# Patient Record
Sex: Male | Born: 1970 | Race: White | Hispanic: No | Marital: Married | Smoking: Never smoker
Health system: Southern US, Community
[De-identification: ages and names within clinical notes are randomized; demographics above are authoritative.]

## PROBLEM LIST (undated history)

## (undated) DIAGNOSIS — Z87442 Personal history of urinary calculi: Secondary | ICD-10-CM

## (undated) DIAGNOSIS — G4730 Sleep apnea, unspecified: Secondary | ICD-10-CM

## (undated) HISTORY — PX: APPENDECTOMY: SHX54

---
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
## 2002-06-01 ENCOUNTER — Encounter: Payer: Self-pay | Admitting: Emergency Medicine

## 2002-06-01 ENCOUNTER — Emergency Department (HOSPITAL_COMMUNITY): Admission: EM | Admit: 2002-06-01 | Discharge: 2002-06-01 | Payer: Self-pay | Admitting: Emergency Medicine

## 2013-06-29 HISTORY — PX: PATELLAR TENDON REPAIR: SHX737

## 2014-10-30 ENCOUNTER — Other Ambulatory Visit: Payer: Self-pay | Admitting: Surgery

## 2014-11-20 ENCOUNTER — Encounter (HOSPITAL_BASED_OUTPATIENT_CLINIC_OR_DEPARTMENT_OTHER): Payer: Self-pay | Admitting: *Deleted

## 2014-11-20 NOTE — Progress Notes (Signed)
This patient has screened at an elevated risk for Obstructive Sleep Apnea using the STOP-Bang tool during a presurgical visit. A score of 4 or greater is an elevated risk. 

## 2014-11-25 NOTE — H&P (Signed)
Christian Foster  Location: [facility] Patient #: [id number] DOB: July 19, 1970 Married / Language: English / Race: White Male  History of Present Illness (Mitchelle Sultan A. Magnus Ivan MD;  The patient is a 44 year old male who presents with skin lesions. This is a pleasant gentleman referred by Dr. Rushie Chestnut for evaluation of an infected sebaceous cyst on his right upper arm/shoulder. He has had for several years but recently became infected. It improved with oral antibiotics. He reports no discomfort today. He is otherwise healthy and without complaints.   Other Problems (Sonya Bynum, CMA Anxiety Disorder Back Pain  Past Surgical History Lamar Laundry Bynum, CMA; Appendectomy Knee Surgery Left. Oral Surgery  Diagnostic Studies History Gilmer Mor, CMA;  Colonoscopy never  Allergies Lamar Laundry Bynum, CMA;  No Known Drug Allergies06/07/2014  Medication History (Sonya Bynum, CMA; Adderall XR (  Capsule ER 24HR, Oral) Active. Escitalopram Oxalate (  Tablet, Oral) Active. Medications Reconciled  Social History Lamar Laundry Bynum, CMA; Alcohol use Occasional alcohol use. Caffeine use Coffee. No drug use Tobacco use Never smoker.  Family History (Sonya Bynum, CMA;  Depression Father, Mother. Hypertension Father. Migraine Headache Mother. Prostate Cancer Father.  Review of Systems Lamar Laundry Bynum CMA;  General Not Present- Appetite Loss, Chills, Fatigue, Fever, Night Sweats, Weight Gain and Weight Loss. Skin Present- New Lesions. Not Present- Change in Wart/Mole, Dryness, Hives, Jaundice, Non-Healing Wounds, Rash and Ulcer. HEENT Present- Oral Ulcers. Not Present- Earache, Hearing Loss, Hoarseness, Nose Bleed, Ringing in the Ears, Seasonal Allergies, Sinus Pain, Sore Throat, Visual Disturbances, Wears glasses/contact lenses and Yellow Eyes. Respiratory Not Present- Bloody sputum, Chronic Cough, Difficulty Breathing, Snoring and Wheezing. Breast Not Present- Breast  Mass, Breast Pain, Nipple Discharge and Skin Changes. Cardiovascular Not Present- Chest Pain, Difficulty Breathing Lying Down, Leg Cramps, Palpitations, Rapid Heart Rate, Shortness of Breath and Swelling of Extremities. Gastrointestinal Not Present- Abdominal Pain, Bloating, Bloody Stool, Change in Bowel Habits, Chronic diarrhea, Constipation, Difficulty Swallowing, Excessive gas, Gets full quickly at meals, Hemorrhoids, Indigestion, Nausea, Rectal Pain and Vomiting. Male Genitourinary Not Present- Blood in Urine, Change in Urinary Stream, Frequency, Impotence, Nocturia, Painful Urination, Urgency and Urine Leakage. Musculoskeletal Present- Back Pain and Joint Pain. Not Present- Joint Stiffness, Muscle Pain, Muscle Weakness and Swelling of Extremities. Neurological Not Present- Decreased Memory, Fainting, Headaches, Numbness, Seizures, Tingling, Tremor, Trouble walking and Weakness. Psychiatric Not Present- Anxiety, Bipolar, Change in Sleep Pattern, Depression, Fearful and Frequent crying. Endocrine Not Present- Cold Intolerance, Excessive Hunger, Hair Changes, Heat Intolerance, Hot flashes and New Diabetes. Hematology Not Present- Easy Bruising, Excessive bleeding, Gland problems, HIV and Persistent Infections.   Vitals (Sonya Bynum CMA; 10/30/2014 10:46 AM Weight: 224 lb Height: 67in Body Surface Area: 2.19 m Body Mass Index: 35.08 kg/m Temp.: 62F(Temporal)  Pulse: 79 (Regular)  BP: 132/80 (Sitting, Left Arm, Standard)    Physical Exam The physical exam findings are as follows: Note:Lungs are clear bilaterally Cardiovascular regular rate and rhythm There is a 4 cm sebaceous cyst on his upper arm/deltoid area with mild erythema and tenderness Lungs clear cv RRR Abdomen soft,NT/ND    Assessment & Plan (Caleigh Rabelo A. Magnus Ivan MD;  INFECTED SEBACEOUS CYST (706.2  L72.3) Impression: Surgical excision is highly recommended given the size of this sebaceous cyst and its prior  infection. I discussed this with him in detail. I discussed the risks of surgery which includes but is not limited to bleeding, infection, having a chronic open wound, recurrence, etc. He understands and wishes to proceed with surgery Current Plans  Started Doxycycline  Hyclate 100MG , 1 (one) Capsule two times daily, #20, 10/30/2014, No Refill.

## 2014-11-26 ENCOUNTER — Encounter (HOSPITAL_BASED_OUTPATIENT_CLINIC_OR_DEPARTMENT_OTHER): Payer: Self-pay | Admitting: *Deleted

## 2014-11-26 ENCOUNTER — Ambulatory Visit (HOSPITAL_BASED_OUTPATIENT_CLINIC_OR_DEPARTMENT_OTHER)
Admission: RE | Admit: 2014-11-26 | Discharge: 2014-11-26 | Disposition: A | Discharge status: Home / Self Care | Payer: 59 | Source: Ambulatory Visit | Attending: Surgery | Admitting: Surgery

## 2014-11-26 ENCOUNTER — Ambulatory Visit (HOSPITAL_BASED_OUTPATIENT_CLINIC_OR_DEPARTMENT_OTHER): Payer: 59 | Admitting: Anesthesiology

## 2014-11-26 ENCOUNTER — Encounter (HOSPITAL_BASED_OUTPATIENT_CLINIC_OR_DEPARTMENT_OTHER)
Admission: RE | Disposition: A | Discharge status: Home / Self Care | Payer: Self-pay | Source: Ambulatory Visit | Attending: Surgery

## 2014-11-26 DIAGNOSIS — F419 Anxiety disorder, unspecified: Secondary | ICD-10-CM | POA: Insufficient documentation

## 2014-11-26 DIAGNOSIS — G4730 Sleep apnea, unspecified: Secondary | ICD-10-CM | POA: Insufficient documentation

## 2014-11-26 DIAGNOSIS — M549 Dorsalgia, unspecified: Secondary | ICD-10-CM | POA: Insufficient documentation

## 2014-11-26 DIAGNOSIS — Z8042 Family history of malignant neoplasm of prostate: Secondary | ICD-10-CM | POA: Insufficient documentation

## 2014-11-26 DIAGNOSIS — L723 Sebaceous cyst: Secondary | ICD-10-CM | POA: Diagnosis not present

## 2014-11-26 DIAGNOSIS — Z79899 Other long term (current) drug therapy: Secondary | ICD-10-CM | POA: Diagnosis not present

## 2014-11-26 DIAGNOSIS — Z8249 Family history of ischemic heart disease and other diseases of the circulatory system: Secondary | ICD-10-CM | POA: Insufficient documentation

## 2014-11-26 DIAGNOSIS — Z818 Family history of other mental and behavioral disorders: Secondary | ICD-10-CM | POA: Insufficient documentation

## 2014-11-26 HISTORY — PX: EAR CYST EXCISION: SHX22

## 2014-11-26 HISTORY — DX: Sleep apnea, unspecified: G47.30

## 2014-11-26 LAB — POCT HEMOGLOBIN-HEMACUE: HEMOGLOBIN: 17.2 g/dL — AB (ref 13.0–17.0)

## 2014-11-26 SURGERY — CYST REMOVAL
Anesthesia: Monitor Anesthesia Care | Laterality: Right

## 2014-11-26 MED ORDER — LACTATED RINGERS IV SOLN: 500.0000 mL | INTRAVENOUS | Status: DC

## 2014-11-26 MED ORDER — CEFAZOLIN SODIUM-DEXTROSE 2-3 GM-% IV SOLR
INTRAVENOUS | Status: AC
  Filled 2014-11-26: qty 50

## 2014-11-26 MED ORDER — MIDAZOLAM HCL 2 MG/2ML IJ SOLN
1.0000 mg | INTRAMUSCULAR | Status: DC | PRN
  Administered 2014-11-26: 2 mg via INTRAVENOUS

## 2014-11-26 MED ORDER — FENTANYL CITRATE (PF) 100 MCG/2ML IJ SOLN
50.0000 ug | INTRAMUSCULAR | Status: DC | PRN
  Administered 2014-11-26: 50 ug via INTRAVENOUS

## 2014-11-26 MED ORDER — ONDANSETRON HCL 4 MG/2ML IJ SOLN
INTRAMUSCULAR | Status: DC | PRN
  Administered 2014-11-26: 4 mg via INTRAVENOUS

## 2014-11-26 MED ORDER — HYDROCODONE-ACETAMINOPHEN 5-325 MG PO TABS: 1.0000 | ORAL_TABLET | ORAL | Status: DC | PRN

## 2014-11-26 MED ORDER — CEFAZOLIN SODIUM-DEXTROSE 2-3 GM-% IV SOLR
2.0000 g | Freq: Three times a day (TID) | INTRAVENOUS | Status: DC
  Administered 2014-11-26: 2 g via INTRAVENOUS

## 2014-11-26 MED ORDER — BUPIVACAINE HCL (PF) 0.25 % IJ SOLN
INTRAMUSCULAR | Status: AC
  Filled 2014-11-26: qty 30

## 2014-11-26 MED ORDER — PROPOFOL INFUSION 10 MG/ML OPTIME
INTRAVENOUS | Status: DC | PRN
  Administered 2014-11-26: 75 ug/kg/min via INTRAVENOUS

## 2014-11-26 MED ORDER — GLYCOPYRROLATE 0.2 MG/ML IJ SOLN: 0.2000 mg | Freq: Once | INTRAMUSCULAR | Status: DC | PRN

## 2014-11-26 MED ORDER — LIDOCAINE HCL (PF) 1 % IJ SOLN
INTRAMUSCULAR | Status: AC
Start: 2014-11-26 — End: 2014-11-26
  Filled 2014-11-26: qty 30

## 2014-11-26 MED ORDER — HYDROMORPHONE HCL 1 MG/ML IJ SOLN: 0.2500 mg | INTRAMUSCULAR | Status: DC | PRN

## 2014-11-26 MED ORDER — LIDOCAINE HCL (PF) 1 % IJ SOLN
INTRAMUSCULAR | Status: DC | PRN
  Administered 2014-11-26: 10 mL

## 2014-11-26 MED ORDER — MIDAZOLAM HCL 2 MG/2ML IJ SOLN
INTRAMUSCULAR | Status: AC
  Filled 2014-11-26: qty 2

## 2014-11-26 MED ORDER — LIDOCAINE HCL (CARDIAC) 20 MG/ML IV SOLN
INTRAVENOUS | Status: DC | PRN
  Administered 2014-11-26: 60 mg via INTRAVENOUS

## 2014-11-26 MED ORDER — PROMETHAZINE HCL 25 MG/ML IJ SOLN
6.2500 mg | INTRAMUSCULAR | Status: DC | PRN
Start: 2014-11-26 — End: 2014-11-26

## 2014-11-26 MED ORDER — BACITRACIN ZINC 500 UNIT/GM EX OINT
TOPICAL_OINTMENT | CUTANEOUS | Status: AC
  Filled 2014-11-26: qty 28.35

## 2014-11-26 MED ORDER — BUPIVACAINE-EPINEPHRINE (PF) 0.5% -1:200000 IJ SOLN
INTRAMUSCULAR | Status: AC
  Filled 2014-11-26: qty 90

## 2014-11-26 MED ORDER — KETOROLAC TROMETHAMINE 30 MG/ML IJ SOLN
INTRAMUSCULAR | Status: DC | PRN
  Administered 2014-11-26: 30 mg via INTRAVENOUS

## 2014-11-26 MED ORDER — SCOPOLAMINE 1 MG/3DAYS TD PT72: 1.0000 | MEDICATED_PATCH | Freq: Once | TRANSDERMAL | Status: DC | PRN

## 2014-11-26 MED ORDER — DOXYCYCLINE HYCLATE 100 MG PO TABS: 100.0000 mg | ORAL_TABLET | Freq: Two times a day (BID) | ORAL | Status: DC

## 2014-11-26 MED ORDER — FENTANYL CITRATE (PF) 100 MCG/2ML IJ SOLN
INTRAMUSCULAR | Status: AC
  Filled 2014-11-26: qty 4

## 2014-11-26 MED ORDER — LACTATED RINGERS IV SOLN
INTRAVENOUS | Status: DC
  Administered 2014-11-26: 09:00:00 via INTRAVENOUS

## 2014-11-26 SURGICAL SUPPLY — 48 items
BLADE CLIPPER SURG (BLADE) ×2 IMPLANT
BLADE HEX COATED 2.75 (ELECTRODE) ×3 IMPLANT
BLADE SURG 15 STRL LF DISP TIS (BLADE) ×1 IMPLANT
BLADE SURG 15 STRL SS (BLADE) ×3
CANISTER SUCT 1200ML W/VALVE (MISCELLANEOUS) IMPLANT
CHLORAPREP W/TINT 26ML (MISCELLANEOUS) ×3 IMPLANT
CLOSURE WOUND 1/2 X4 (GAUZE/BANDAGES/DRESSINGS) ×1
COVER BACK TABLE 60X90IN (DRAPES) ×3 IMPLANT
COVER MAYO STAND STRL (DRAPES) ×3 IMPLANT
DECANTER SPIKE VIAL GLASS SM (MISCELLANEOUS) IMPLANT
DRAPE LAPAROTOMY 100X72 PEDS (DRAPES) ×3 IMPLANT
DRAPE UTILITY XL STRL (DRAPES) ×3 IMPLANT
DRSG TEGADERM 4X4.75 (GAUZE/BANDAGES/DRESSINGS) ×3 IMPLANT
ELECT REM PT RETURN 9FT ADLT (ELECTROSURGICAL) ×3
ELECTRODE REM PT RTRN 9FT ADLT (ELECTROSURGICAL) ×1 IMPLANT
GLOVE BIOGEL PI IND STRL 7.0 (GLOVE) IMPLANT
GLOVE BIOGEL PI INDICATOR 7.0 (GLOVE) ×2
GLOVE ECLIPSE 6.5 STRL STRAW (GLOVE) ×2 IMPLANT
GLOVE EXAM NITRILE MD LF STRL (GLOVE) ×2 IMPLANT
GLOVE SURG SIGNA 7.5 PF LTX (GLOVE) ×3 IMPLANT
GOWN STRL REUS W/ TWL LRG LVL3 (GOWN DISPOSABLE) ×1 IMPLANT
GOWN STRL REUS W/ TWL XL LVL3 (GOWN DISPOSABLE) ×1 IMPLANT
GOWN STRL REUS W/TWL LRG LVL3 (GOWN DISPOSABLE) ×3
GOWN STRL REUS W/TWL XL LVL3 (GOWN DISPOSABLE) ×3
LIQUID BAND (GAUZE/BANDAGES/DRESSINGS) ×3 IMPLANT
NDL HYPO 25X1 1.5 SAFETY (NEEDLE) ×1 IMPLANT
NEEDLE HYPO 25X1 1.5 SAFETY (NEEDLE) ×3 IMPLANT
NS IRRIG 1000ML POUR BTL (IV SOLUTION) IMPLANT
PACK BASIN DAY SURGERY FS (CUSTOM PROCEDURE TRAY) ×3 IMPLANT
PENCIL BUTTON HOLSTER BLD 10FT (ELECTRODE) ×3 IMPLANT
SLEEVE SCD COMPRESS KNEE MED (MISCELLANEOUS) ×2 IMPLANT
SPONGE GAUZE 4X4 12PLY STER LF (GAUZE/BANDAGES/DRESSINGS) ×3 IMPLANT
SPONGE LAP 4X18 X RAY DECT (DISPOSABLE) ×3 IMPLANT
STRIP CLOSURE SKIN 1/2X4 (GAUZE/BANDAGES/DRESSINGS) ×2 IMPLANT
SUT MNCRL AB 4-0 PS2 18 (SUTURE) ×2 IMPLANT
SUT PROLENE 3 0 PS 2 (SUTURE) IMPLANT
SUT VIC AB 2-0 SH 27 (SUTURE)
SUT VIC AB 2-0 SH 27XBRD (SUTURE) IMPLANT
SUT VIC AB 3-0 SH 27 (SUTURE) ×3
SUT VIC AB 3-0 SH 27X BRD (SUTURE) IMPLANT
SYR BULB 3OZ (MISCELLANEOUS) IMPLANT
SYR CONTROL 10ML LL (SYRINGE) ×3 IMPLANT
TOWEL OR 17X24 6PK STRL BLUE (TOWEL DISPOSABLE) ×1 IMPLANT
TOWEL OR NON WOVEN STRL DISP B (DISPOSABLE) ×3 IMPLANT
TRAY DSU PREP LF (CUSTOM PROCEDURE TRAY) IMPLANT
TUBE CONNECTING 20'X1/4 (TUBING)
TUBE CONNECTING 20X1/4 (TUBING) IMPLANT
YANKAUER SUCT BULB TIP NO VENT (SUCTIONS) IMPLANT

## 2014-11-26 NOTE — Anesthesia Postprocedure Evaluation (Signed)
Anesthesia Post Note  Patient: Christian KindredAlex Foster  Procedure(s) Performed: Procedure(s) (LRB): EXCISION OF RIGHT ARM SEBACEOUS CYST (Right)  Anesthesia type: MAC  Patient location: PACU  Post pain: Pain level controlled  Post assessment: Patient's Cardiovascular Status Stable  Last Vitals:  Filed Vitals:   11/26/14 1030  BP: 132/75  Pulse: 63  Temp:   Resp: 15    Post vital signs: Reviewed and stable  Level of consciousness: sedated  Complications: No apparent anesthesia complications

## 2014-11-26 NOTE — Op Note (Signed)
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

## 2014-11-26 NOTE — Interval H&P Note (Signed)
History and Physical Interval Note:no change in H and P  11/26/2014 9:03 AM  Christian Foster  has presented today for surgery, with the diagnosis of Cyst on Right Arm  The various methods of treatment have been discussed with the patient and family. After consideration of risks, benefits and other options for treatment, the patient has consented to  Procedure(s): EXCISION OF RIGHT ARM  (Right) as a surgical intervention .  The patient's history has been reviewed, patient examined, no change in status, stable for surgery.  I have reviewed the patient's chart and labs.  Questions were answered to the patient's satisfaction.     [name] A

## 2014-11-26 NOTE — Transfer of Care (Signed)
Immediate Anesthesia Transfer of Care Note  Patient: Tora KindredAlex Araki  Procedure(s) Performed: Procedure(s): EXCISION OF RIGHT ARM SEBACEOUS CYST (Right)  Patient Location: PACU  Anesthesia Type:MAC  Level of Consciousness: awake, alert , oriented and patient cooperative  Airway & Oxygen Therapy: Patient Spontanous Breathing and Patient connected to face mask oxygen  Post-op Assessment: Report given to RN and Post -op Vital signs reviewed and stable  Post vital signs: Reviewed and stable  Last Vitals:  Filed Vitals:   11/26/14 0904  BP: 124/72  Temp: 36.7 C  Resp: 20    Complications: No apparent anesthesia complications

## 2014-11-26 NOTE — Anesthesia Preprocedure Evaluation (Addendum)
Anesthesia Evaluation  Patient identified by MRN, date of birth, ID band Patient awake    Reviewed: Allergy & Precautions, NPO status , Patient's Chart, lab work & pertinent test results  History of Anesthesia Complications Negative for: history of anesthetic complications  Airway Mallampati: II  TM Distance: >3 FB Neck ROM: Full    Dental  (+) Teeth Intact, Dental Advisory Given   Pulmonary sleep apnea ,    Pulmonary exam normal       Cardiovascular negative cardio ROS Normal cardiovascular exam    Neuro/Psych negative neurological ROS  negative psych ROS   GI/Hepatic Neg liver ROS,   Endo/Other  negative endocrine ROS  Renal/GU negative Renal ROS     Musculoskeletal   Abdominal   Peds  Hematology   Anesthesia Other Findings   Reproductive/Obstetrics                            Anesthesia Physical Anesthesia Plan  ASA: II  Anesthesia Plan: MAC   Post-op Pain Management:    Induction:   Airway Management Planned: Simple Face Mask  Additional Equipment:   Intra-op Plan:   Post-operative Plan:   Informed Consent: I have reviewed the patients History and Physical, chart, labs and discussed the procedure including the risks, benefits and alternatives for the proposed anesthesia with the patient or authorized representative who has indicated his/her understanding and acceptance.   Dental advisory given  Plan Discussed with: CRNA, Anesthesiologist and Surgeon  Anesthesia Plan Comments:         Anesthesia Quick Evaluation

## 2014-11-26 NOTE — Discharge Instructions (Signed)
Ok to shower starting tomorrow  No soaking in a tub or swimming for one week  Ibuprofen also for pain     Post Anesthesia Home Care Instructions  Activity: Get plenty of rest for the remainder of the day. A responsible adult should stay with you for 24 hours following the procedure.  For the next 24 hours, DO NOT: -Drive a car -Advertising copywriterperate machinery -Drink alcoholic beverages -Take any medication unless instructed by your physician -Make any legal decisions or sign important papers.  Meals: Start with liquid foods such as gelatin or soup. Progress to regular foods as tolerated. Avoid greasy, spicy, heavy foods. If nausea and/or vomiting occur, drink only clear liquids until the nausea and/or vomiting subsides. Call your physician if vomiting continues.  Special Instructions/Symptoms: Your throat may feel dry or sore from the anesthesia or the breathing tube placed in your throat during surgery. If this causes discomfort, gargle with warm salt water. The discomfort should disappear within 24 hours.  If you had a scopolamine patch placed behind your ear for the management of post- operative nausea and/or vomiting:  1. The medication in the patch is effective for 72 hours, after which it should be removed.  Wrap patch in a tissue and discard in the trash. Wash hands thoroughly with soap and water. 2. You may remove the patch earlier than 72 hours if you experience unpleasant side effects which may include dry mouth, dizziness or visual disturbances. 3. Avoid touching the patch. Wash your hands with soap and water after contact with the patch.

## 2014-11-26 NOTE — Anesthesia Procedure Notes (Signed)
Procedure Name: MAC Date/Time: 11/26/2014 9:30 AM Performed by: Shaeley Segall D Pre-anesthesia Checklist: Emergency Drugs available, Suction available, Patient being monitored, Patient identified and Timeout performed Patient Re-evaluated:Patient Re-evaluated prior to inductionOxygen Delivery Method: Simple face mask

## 2014-11-27 ENCOUNTER — Encounter (HOSPITAL_BASED_OUTPATIENT_CLINIC_OR_DEPARTMENT_OTHER): Payer: Self-pay | Admitting: Surgery

## 2014-11-27 NOTE — Op Note (Signed)
[name]:  Christian Foster, Christian Foster                ACCOUNT NO.:  [id number]  MEDICAL RECORD NO.:  [id number]  LOCATION:                               FACILITY:  [facility]  PHYSICIAN:  Abigail Miyamotoouglas Darral Rishel, M.D. 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  PROCEDURE:  Excision of right arm, 3 cm sebaceous cyst.  SURGEON:  Abigail Miyamotoouglas Lorimer Tiberio, M.D.  ANESTHESIA:  1% lidocaine and monitored anesthesia care.  ESTIMATED BLOOD LOSS:  Minimal.  FINDINGS:  The patient was found to have a chronic appearing sebaceous cyst which was approximately 3 cm in size on the right lateral arm.  It has been previously infected.  PROCEDURE IN DETAIL:  The patient was brought to the operating room, identified as McDonald's Corporationlice Facundo.  He was placed supine on the operating room table and anesthesia was induced.  His right arm was then prepped and draped in usual sterile fashion.  I anesthetized the skin over the palpable cyst just over the lateral arm below the deltoid.  I made an incision with a scalpel and I took this down to the cyst with the electrocautery.  The cyst was consistent with a large sebaceous cyst.  I excised in its entirety and sent to Pathology for evaluation.  I appeared to remove all of the capsule.  I anesthetized the wound further with lidocaine.  I achieved hemostasis with cautery.  I then closed the subcutaneous tissue with interrupted 3-0 Vicryl sutures and closed the skin with running 4-0 Monocryl.  Skin glue was then applied.  The patient tolerated the procedure well.  All the counts were correct at the end of procedure.  The patient was then taken in stable condition from the operating room to the recovery room.     Abigail Miyamotoouglas Lumi Winslett,  M.D.     DB/MEDQ  D:  11/26/2014  T:  11/26/2014  Job:  [id number]

## 2015-10-23 ENCOUNTER — Emergency Department (HOSPITAL_BASED_OUTPATIENT_CLINIC_OR_DEPARTMENT_OTHER): Payer: Commercial Managed Care - HMO

## 2015-10-23 ENCOUNTER — Encounter (HOSPITAL_BASED_OUTPATIENT_CLINIC_OR_DEPARTMENT_OTHER): Payer: Self-pay | Admitting: Emergency Medicine

## 2015-10-23 ENCOUNTER — Emergency Department (HOSPITAL_BASED_OUTPATIENT_CLINIC_OR_DEPARTMENT_OTHER)
Admission: EM | Admit: 2015-10-23 | Discharge: 2015-10-23 | Disposition: A | Discharge status: Home / Self Care | Payer: Commercial Managed Care - HMO | Attending: Emergency Medicine | Admitting: Emergency Medicine

## 2015-10-23 DIAGNOSIS — Y999 Unspecified external cause status: Secondary | ICD-10-CM | POA: Insufficient documentation

## 2015-10-23 DIAGNOSIS — Y9389 Activity, other specified: Secondary | ICD-10-CM | POA: Diagnosis not present

## 2015-10-23 DIAGNOSIS — Y929 Unspecified place or not applicable: Secondary | ICD-10-CM | POA: Insufficient documentation

## 2015-10-23 DIAGNOSIS — W293XXA Contact with powered garden and outdoor hand tools and machinery, initial encounter: Secondary | ICD-10-CM | POA: Diagnosis not present

## 2015-10-23 DIAGNOSIS — S61210A Laceration without foreign body of right index finger without damage to nail, initial encounter: Secondary | ICD-10-CM | POA: Insufficient documentation

## 2015-10-23 DIAGNOSIS — T148XXA Other injury of unspecified body region, initial encounter: Secondary | ICD-10-CM

## 2015-10-23 DIAGNOSIS — S61219A Laceration without foreign body of unspecified finger without damage to nail, initial encounter: Secondary | ICD-10-CM

## 2015-10-23 DIAGNOSIS — S6991XA Unspecified injury of right wrist, hand and finger(s), initial encounter: Secondary | ICD-10-CM | POA: Diagnosis present

## 2015-10-23 MED ORDER — CEPHALEXIN 500 MG PO CAPS: 500.0000 mg | ORAL_CAPSULE | Freq: Three times a day (TID) | ORAL | Status: DC

## 2015-10-23 MED ORDER — LIDOCAINE HCL (PF) 1 % IJ SOLN
10.0000 mL | Freq: Once | INTRAMUSCULAR | Status: AC
  Administered 2015-10-23: 10 mL
  Filled 2015-10-23: qty 10

## 2015-10-23 NOTE — ED Provider Notes (Signed)
CSN: [id number]     Arrival date & time 10/23/15  1300 History   First MD Initiated Contact with Patient 10/23/15 1449     Chief Complaint  Patient presents with  . Finger Injury     (Consider location/radiation/quality/duration/timing/severity/associated sxs/prior Treatment) HPI   Left handed patient presents with injury to his right index finger that occurred while he was using a chain saw.  States he accidentally cut the finger while he was rushing and being impatient using the machine.  Denies any other injury . Denies weakness or numbness of the finger.  He is not on blood thinners.    Past Medical History  Diagnosis Date  . Sleep apnea     Pt states he was told after last surgery that he needed to be tested for sleep apnea. stated he "stopped breathing and had to be bagged" Pt has not had follow up testing   Past Surgical History  Procedure Laterality Date  . Patellar tendon repair Left feb 2015  . Appendectomy      35 yrs ago  . Knee surgery Right 1991  . Wisdom tooth extraction Bilateral 1992    all 4 removed  . Ear cyst excision Right 11/26/2014    Procedure: EXCISION OF RIGHT ARM SEBACEOUS CYST;  Surgeon: Abigail Miyamotoouglas Blackman, MD;  Location: [facility];  Service: General;  Laterality: Right;   No family history on file. Social History  Substance Use Topics  . Smoking status: Never Smoker   . Smokeless tobacco: Never Used  . Alcohol Use: Yes     Comment: ocassional    Review of Systems  Constitutional: Negative for fever and chills.  Skin: Positive for wound. Negative for color change and pallor.  Allergic/Immunologic: Negative for immunocompromised state.  Neurological: Negative for weakness and numbness.  Hematological: Does not bruise/bleed easily.  Psychiatric/Behavioral: Positive for self-injury (accidental ).      Allergies  Review of patient's allergies indicates no known allergies.  Home Medications   Prior to Admission medications    Medication Sig Start Date End Date Taking? Authorizing Provider  amphetamine-dextroamphetamine (ADDERALL XR) 20 MG 24 hr capsule Take 20 mg by mouth daily.    Historical Provider, MD  doxycycline (VIBRA-TABS) 100 MG tablet Take 1 tablet (100 mg total) by mouth 2 (two) times daily. 11/26/14   Abigail Miyamotoouglas Blackman, MD  escitalopram (LEXAPRO) 20 MG tablet Take 20 mg by mouth daily.    Historical Provider, MD  HYDROcodone-acetaminophen (NORCO) 5-325 MG per tablet Take 1-2 tablets by mouth every 4 (four) hours as needed. 11/26/14   Abigail Miyamotoouglas Blackman, MD   BP 136/94 mmHg  Pulse 88  Temp(Src) 98.4 F (36.9 C) (Oral)  Resp 18  Ht 5\' 7"  (1.702 m)  Wt 105.688 kg  BMI 36.48 kg/m2  SpO2 100% Physical Exam  Constitutional: He appears well-developed and well-nourished. No distress.  HENT:  Head: Normocephalic and atraumatic.  Neck: Neck supple.  Pulmonary/Chest: Effort normal.  Musculoskeletal:  Right upper extremity:  Small but deep laceration of the distal 2nd finger not involving the nailbed.  Full active range of motion of all digits, strength 5/5, sensation intact, capillary refill < 2 seconds.    Neurological: He is alert.  Skin: He is not diaphoretic.  Nursing note and vitals reviewed.   ED Course  Procedures (including critical care time) Labs Review Labs Reviewed - No data to display  Imaging Review No results found. I have personally reviewed and evaluated these images and lab  results as part of my medical decision-making.   EKG Interpretation None       LACERATION REPAIR Performed by: Trixie Dredge Authorized by: Trixie Dredge Consent: Verbal consent obtained. Risks and benefits: risks, benefits and alternatives were discussed Consent given by: patient Patient identity confirmed: provided demographic data Prepped and Draped in normal sterile fashion Wound explored  Laceration Location: right index finger  Laceration Length: 1cm  No Foreign Bodies seen or  palpated  Anesthesia: digital block  Local anesthetic: lidocaine 1% no epinephrine  Anesthetic total: 5 ml  Irrigation method: syringe Amount of cleaning: standard  Skin closure: 5-0 vicryl  Number of sutures: 1  Technique: simple interrupted  Surgicil placed over skin avulsion and finger wrapped.   Patient tolerance: Patient tolerated the procedure well with no immediate complications.   MDM   Final diagnoses:  Finger laceration, initial encounter  Skin avulsion    Afebrile, nontoxic patient with injury to his right index finger while using a chainsaw.  Neurovascularly intact.   Xray negative.  Digital block performed with thorough irrigation, single dissolvable suture placed through skin flap, avulsion remained open, surgicil placed over avulses area with bandaging. Pt aware he will likely have a depressed area and scar upon healing.  Discussed home wound care. D/C home with short course of keflex to prevent infection.  Pt declined finger splint.  PCP follow up, ED for worsening symptoms.    Discussed result, findings, treatment, and follow up  with patient.  Pt given return precautions.  Pt verbalizes understanding and agrees with plan.       Trixie Dredge, PA-C 10/23/15 1656  Doug Sou, MD 10/24/15 [phone]

## 2015-10-23 NOTE — Discharge Instructions (Signed)
Read the information below.  Use the prescribed medication as directed.  Please discuss all new medications with your pharmacist.  You may return to the Emergency Department at any time for worsening condition or any new symptoms that concern you.  If you develop redness, swelling, pus draining from the wound, difficulty moving your finger, increased pain, or fevers greater than 100.4, return to the ER immediately for a recheck.     Laceration Care, Adult A laceration is a cut that goes through all of the layers of the skin and into the tissue that is right under the skin. Some lacerations heal on their own. Others need to be closed with stitches (sutures), staples, skin adhesive strips, or skin glue. Proper laceration care minimizes the risk of infection and helps the laceration to heal better. HOW TO CARE FOR YOUR LACERATION If sutures or staples were used:  Keep the wound clean and dry.  If you were given a bandage (dressing), you should change it at least one time per day or as told by your health care provider. You should also change it if it becomes wet or dirty.  Keep the wound completely dry for the first 24 hours or as told by your health care provider. After that time, you may shower or bathe. However, make sure that the wound is not soaked in water until after the sutures or staples have been removed.  Clean the wound one time each day or as told by your health care provider:  Wash the wound with soap and water.  Rinse the wound with water to remove all soap.  Pat the wound dry with a clean towel. Do not rub the wound.  After cleaning the wound, apply a thin layer of antibiotic ointmentas told by your health care provider. This will help to prevent infection and keep the dressing from sticking to the wound.  Have the sutures or staples removed as told by your health care provider. If skin adhesive strips were used:  Keep the wound clean and dry.  If you were given a bandage  (dressing), you should change it at least one time per day or as told by your health care provider. You should also change it if it becomes dirty or wet.  Do not get the skin adhesive strips wet. You may shower or bathe, but be careful to keep the wound dry.  If the wound gets wet, pat it dry with a clean towel. Do not rub the wound.  Skin adhesive strips fall off on their own. You may trim the strips as the wound heals. Do not remove skin adhesive strips that are still stuck to the wound. They will fall off in time. If skin glue was used:  Try to keep the wound dry, but you may briefly wet it in the shower or bath. Do not soak the wound in water, such as by swimming.  After you have showered or bathed, gently pat the wound dry with a clean towel. Do not rub the wound.  Do not do any activities that will make you sweat heavily until the skin glue has fallen off on its own.  Do not apply liquid, cream, or ointment medicine to the wound while the skin glue is in place. Using those may loosen the film before the wound has healed.  If you were given a bandage (dressing), you should change it at least one time per day or as told by your health care provider. You should  also change it if it becomes dirty or wet.  If a dressing is placed over the wound, be careful not to apply tape directly over the skin glue. Doing that may cause the glue to be pulled off before the wound has healed.  Do not pick at the glue. The skin glue usually remains in place for 5-10 days, then it falls off of the skin. General Instructions  Take over-the-counter and prescription medicines only as told by your health care provider.  If you were prescribed an antibiotic medicine or ointment, take or apply it as told by your doctor. Do not stop using it even if your condition improves.  To help prevent scarring, make sure to cover your wound with sunscreen whenever you are outside after stitches are removed, after adhesive  strips are removed, or when glue remains in place and the wound is healed. Make sure to wear a sunscreen of at least 30 SPF.  Do not scratch or pick at the wound.  Keep all follow-up visits as told by your health care provider. This is important.  Check your wound every day for signs of infection. Watch for:  Redness, swelling, or pain.  Fluid, blood, or pus.  Raise (elevate) the injured area above the level of your heart while you are sitting or lying down, if possible. SEEK MEDICAL CARE IF:  You received a tetanus shot and you have swelling, severe pain, redness, or bleeding at the injection site.  You have a fever.  A wound that was closed breaks open.  You notice a bad smell coming from your wound or your dressing.  You notice something coming out of the wound, such as wood or glass.  Your pain is not controlled with medicine.  You have increased redness, swelling, or pain at the site of your wound.  You have fluid, blood, or pus coming from your wound.  You notice a change in the color of your skin near your wound.  You need to change the dressing frequently due to fluid, blood, or pus draining from the wound.  You develop a new rash.  You develop numbness around the wound. SEEK IMMEDIATE MEDICAL CARE IF:  You develop severe swelling around the wound.  Your pain suddenly increases and is severe.  You develop painful lumps near the wound or on skin that is anywhere on your body.  You have a red streak going away from your wound.  The wound is on your hand or foot and you cannot properly move a finger or toe.  The wound is on your hand or foot and you notice that your fingers or toes look pale or bluish.   This information is not intended to replace advice given to you by your health care provider. Make sure you discuss any questions you have with your health care provider.   Document Released: 05/15/2005 Document Revised: 09/29/2014 Document Reviewed:  05/11/2014 Elsevier Interactive Patient Education [employer]2016 [employer]

## 2015-10-23 NOTE — ED Notes (Signed)
Pt was using chain saw, saw slipped and cut 2nd right finger tip.  Moderate active bleeding at present.  Pressure gauze dressing applied and wound cleaned with soap & water.

## 2018-01-24 ENCOUNTER — Other Ambulatory Visit: Payer: Self-pay | Admitting: Urology

## 2018-02-04 ENCOUNTER — Encounter (HOSPITAL_COMMUNITY)
Admission: RE | Disposition: A | Discharge status: Home / Self Care | Payer: Self-pay | Source: Ambulatory Visit | Attending: Urology

## 2018-02-04 ENCOUNTER — Encounter (HOSPITAL_COMMUNITY): Payer: Self-pay | Admitting: *Deleted

## 2018-02-04 ENCOUNTER — Ambulatory Visit (HOSPITAL_COMMUNITY)
Admission: RE | Admit: 2018-02-04 | Discharge: 2018-02-04 | Disposition: A | Discharge status: Home / Self Care | Payer: 59 | Source: Ambulatory Visit | Attending: Urology | Admitting: Urology

## 2018-02-04 ENCOUNTER — Other Ambulatory Visit: Payer: Self-pay

## 2018-02-04 ENCOUNTER — Ambulatory Visit (HOSPITAL_COMMUNITY): Payer: 59

## 2018-02-04 DIAGNOSIS — Z79899 Other long term (current) drug therapy: Secondary | ICD-10-CM | POA: Insufficient documentation

## 2018-02-04 DIAGNOSIS — F419 Anxiety disorder, unspecified: Secondary | ICD-10-CM | POA: Insufficient documentation

## 2018-02-04 DIAGNOSIS — G4730 Sleep apnea, unspecified: Secondary | ICD-10-CM | POA: Diagnosis not present

## 2018-02-04 DIAGNOSIS — F329 Major depressive disorder, single episode, unspecified: Secondary | ICD-10-CM | POA: Insufficient documentation

## 2018-02-04 DIAGNOSIS — N201 Calculus of ureter: Secondary | ICD-10-CM | POA: Insufficient documentation

## 2018-02-04 HISTORY — PX: EXTRACORPOREAL SHOCK WAVE LITHOTRIPSY: SHX1557

## 2018-02-04 HISTORY — DX: Personal history of urinary calculi: Z87.442

## 2018-02-04 SURGERY — LITHOTRIPSY, ESWL
Anesthesia: LOCAL | Laterality: Left

## 2018-02-04 MED ORDER — DIAZEPAM 5 MG PO TABS
10.0000 mg | ORAL_TABLET | ORAL | Status: AC
  Administered 2018-02-04: 10 mg via ORAL
  Filled 2018-02-04: qty 2

## 2018-02-04 MED ORDER — CIPROFLOXACIN HCL 500 MG PO TABS
500.0000 mg | ORAL_TABLET | ORAL | Status: AC
  Administered 2018-02-04: 500 mg via ORAL
  Filled 2018-02-04: qty 1

## 2018-02-04 MED ORDER — SODIUM CHLORIDE 0.9 % IV SOLN
INTRAVENOUS | Status: DC
  Administered 2018-02-04: 09:00:00 via INTRAVENOUS

## 2018-02-04 MED ORDER — DIPHENHYDRAMINE HCL 25 MG PO CAPS
25.0000 mg | ORAL_CAPSULE | ORAL | Status: AC
  Administered 2018-02-04: 25 mg via ORAL
  Filled 2018-02-04: qty 1

## 2018-02-04 NOTE — Discharge Instructions (Signed)
Lithotripsy, Care After °This sheet gives you information about how to care for yourself after your procedure. Your health care provider may also give you more specific instructions. If you have problems or questions, contact your health care provider. °What can I expect after the procedure? °After the procedure, it is common to have: °· Some blood in your urine. This should only last for a few days. °· Soreness in your back, sides, or upper abdomen for a few days. °· Blotches or bruises on your back where the pressure wave entered the skin. °· Pain, discomfort, or nausea when pieces (fragments) of the kidney stone move through the tube that carries urine from the kidney to the bladder (ureter). Stone fragments may pass soon after the procedure, but they may continue to pass for up to 4-8 weeks. °? If you have severe pain or nausea, contact your health care provider. This may be caused by a large stone that was not broken up, and this may mean that you need more treatment. °· Some pain or discomfort during urination. °· Some pain or discomfort in the lower abdomen or (in men) at the base of the penis. ° °Follow these instructions at home: °Medicines °· Take over-the-counter and prescription medicines only as told by your health care provider. °· If you were prescribed an antibiotic medicine, take it as told by your health care provider. Do not stop taking the antibiotic even if you start to feel better. °· Do not drive for 24 hours if you were given a medicine to help you relax (sedative). °· Do not drive or use heavy machinery while taking prescription pain medicine. °Eating and drinking °· Drink enough water and fluids to keep your urine clear or pale yellow. This helps any remaining pieces of the stone to pass. It can also help prevent new stones from forming. °· Eat plenty of fresh fruits and vegetables. °· Follow instructions from your health care provider about eating and drinking restrictions. You may be  instructed: °? To reduce how much salt (sodium) you eat or drink. Check ingredients and nutrition facts on packaged foods and beverages. °? To reduce how much meat you eat. °· Eat the recommended amount of calcium for your age and gender. Ask your health care provider how much calcium you should have. °General instructions °· Get plenty of rest. °· Most people can resume normal activities 1-2 days after the procedure. Ask your health care provider what activities are safe for you. °· If directed, strain all urine through the strainer that was provided by your health care provider. °? Keep all fragments for your health care provider to see. Any stones that are found may be sent to a medical lab for examination. The stone may be as small as a grain of salt. °· Keep all follow-up visits as told by your health care provider. This is important. °Contact a health care provider if: °· You have pain that is severe or does not get better with medicine. °· You have nausea that is severe or does not go away. °· You have blood in your urine longer than your health care provider told you to expect. °· You have more blood in your urine. °· You have pain during urination that does not go away. °· You urinate more frequently than usual and this does not go away. °· You develop a rash or any other possible signs of an allergic reaction. °Get help right away if: °· You have severe pain in   your back, sides, or upper abdomen. °· You have severe pain while urinating. °· Your urine is very dark red. °· You have blood in your stool (feces). °· You cannot pass any urine at all. °· You feel a strong urge to urinate after emptying your bladder. °· You have a fever or chills. °· You develop shortness of breath, difficulty breathing, or chest pain. °· You have severe nausea that leads to persistent vomiting. °· You faint. °Summary °· After this procedure, it is common to have some pain, discomfort, or nausea when pieces (fragments) of the  kidney stone move through the tube that carries urine from the kidney to the bladder (ureter). If this pain or nausea is severe, however, you should contact your health care provider. °· Most people can resume normal activities 1-2 days after the procedure. Ask your health care provider what activities are safe for you. °· Drink enough water and fluids to keep your urine clear or pale yellow. This helps any remaining pieces of the stone to pass, and it can help prevent new stones from forming. °· If directed, strain your urine and keep all fragments for your health care provider to see. Fragments or stones may be as small as a grain of salt. °· Get help right away if you have severe pain in your back, sides, or upper abdomen or have severe pain while urinating. °This information is not intended to replace advice given to you by your health care provider. Make sure you discuss any questions you have with your health care provider. °Document Released: 06/04/2007 Document Revised: 04/05/2016 Document Reviewed: 04/05/2016 °Elsevier Interactive Patient Education © 2018 [employer] ° °

## 2018-02-04 NOTE — Op Note (Signed)
See [facility] OP note scanned into chart. Also because of the size, density, location and other factors that cannot be anticipated I feel this will likely be a staged procedure. This fact supersedes any indication in the scanned [facility] operative note to the contrary.  

## 2018-02-04 NOTE — H&P (Signed)
See scanned H&P

## 2018-02-18 ENCOUNTER — Encounter (HOSPITAL_COMMUNITY): Payer: Self-pay | Admitting: Urology

## 2020-07-22 IMAGING — CR DG ABDOMEN 1V
2 series · 2 of 2 positions shown · non-contrast
Comparison: 01/21/2018

CLINICAL DATA: Preoperative for left ureteral stone treatment.

EXAM:
ABDOMEN - 1 VIEW

[t abdomen supine (1 of 2)]
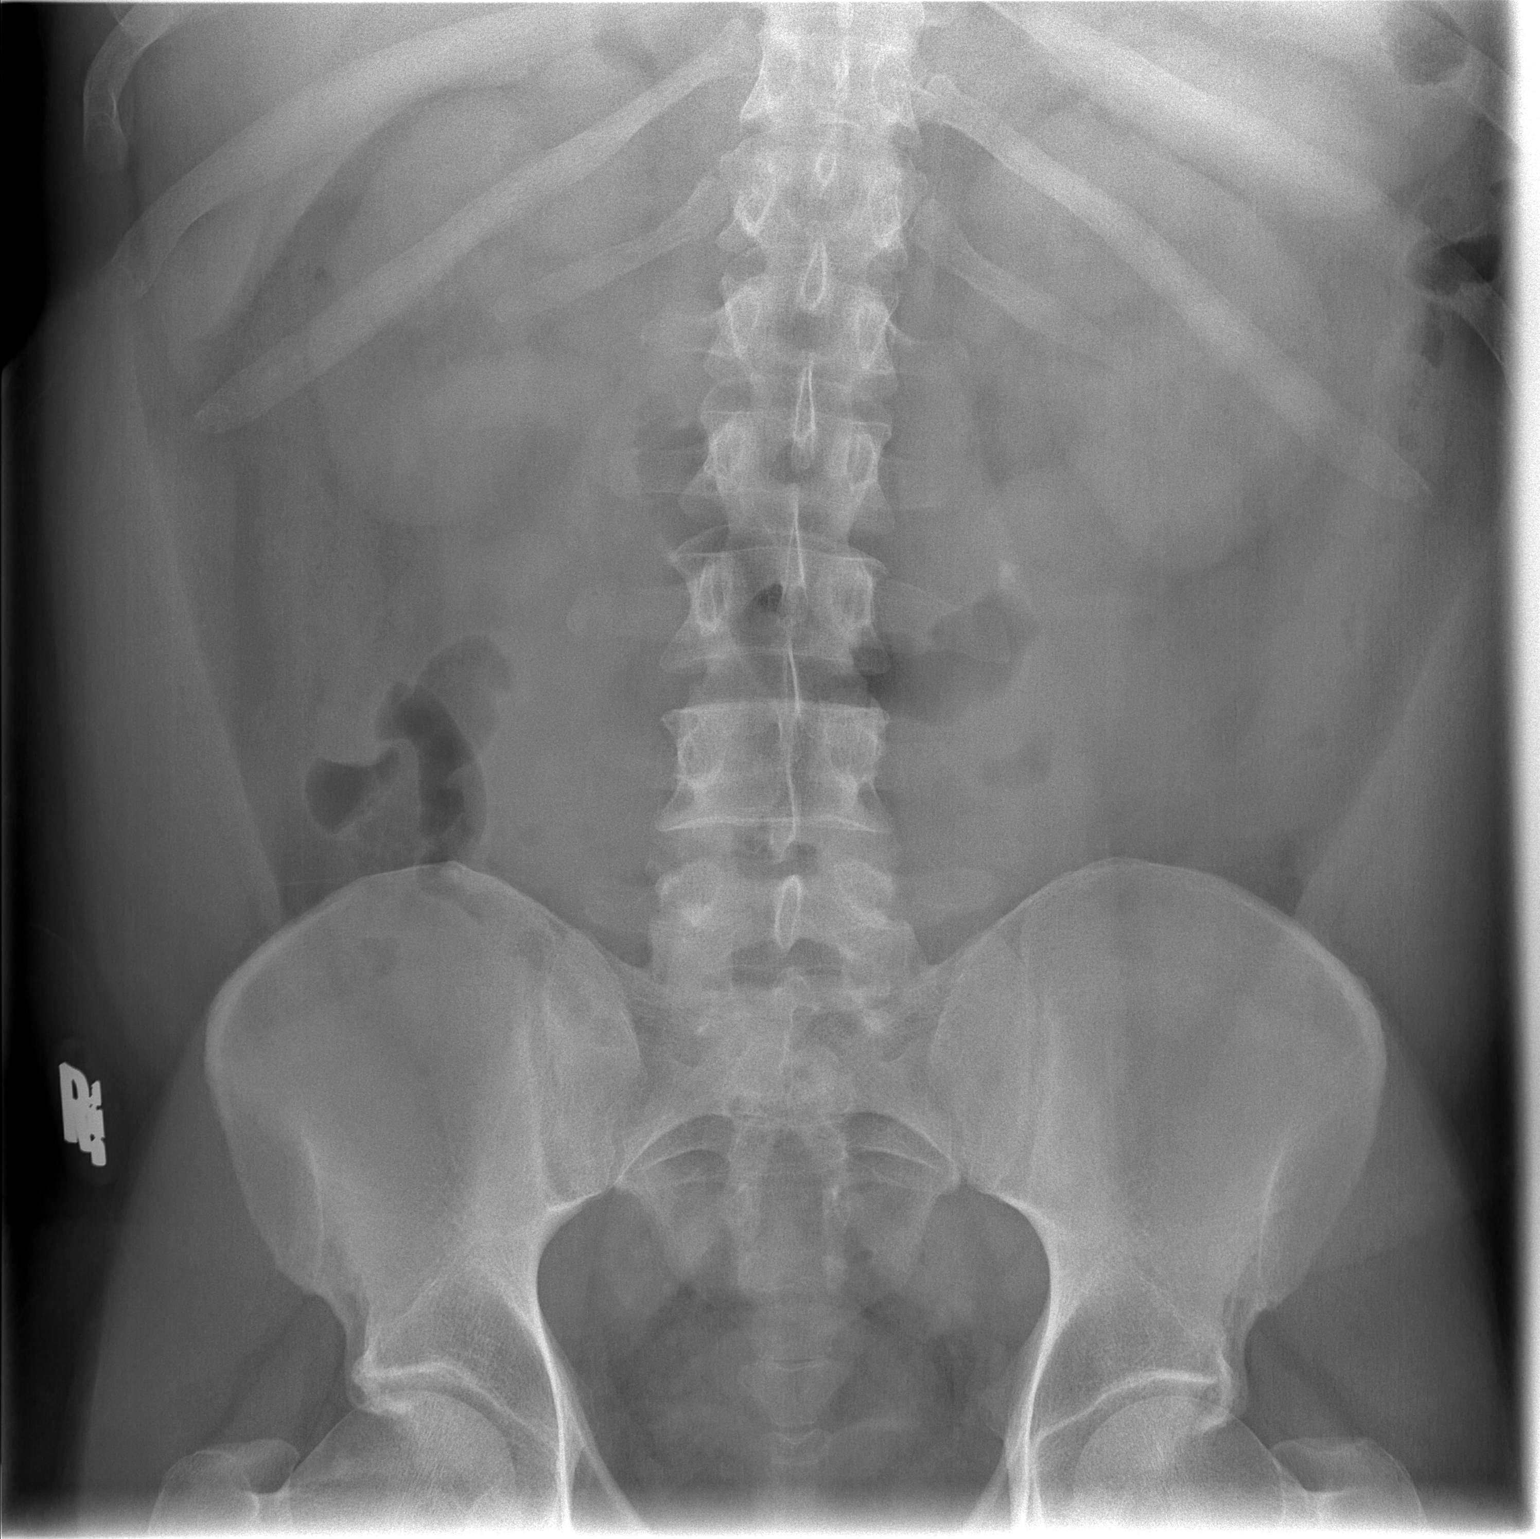

[t abdomen supine (2 of 2)]
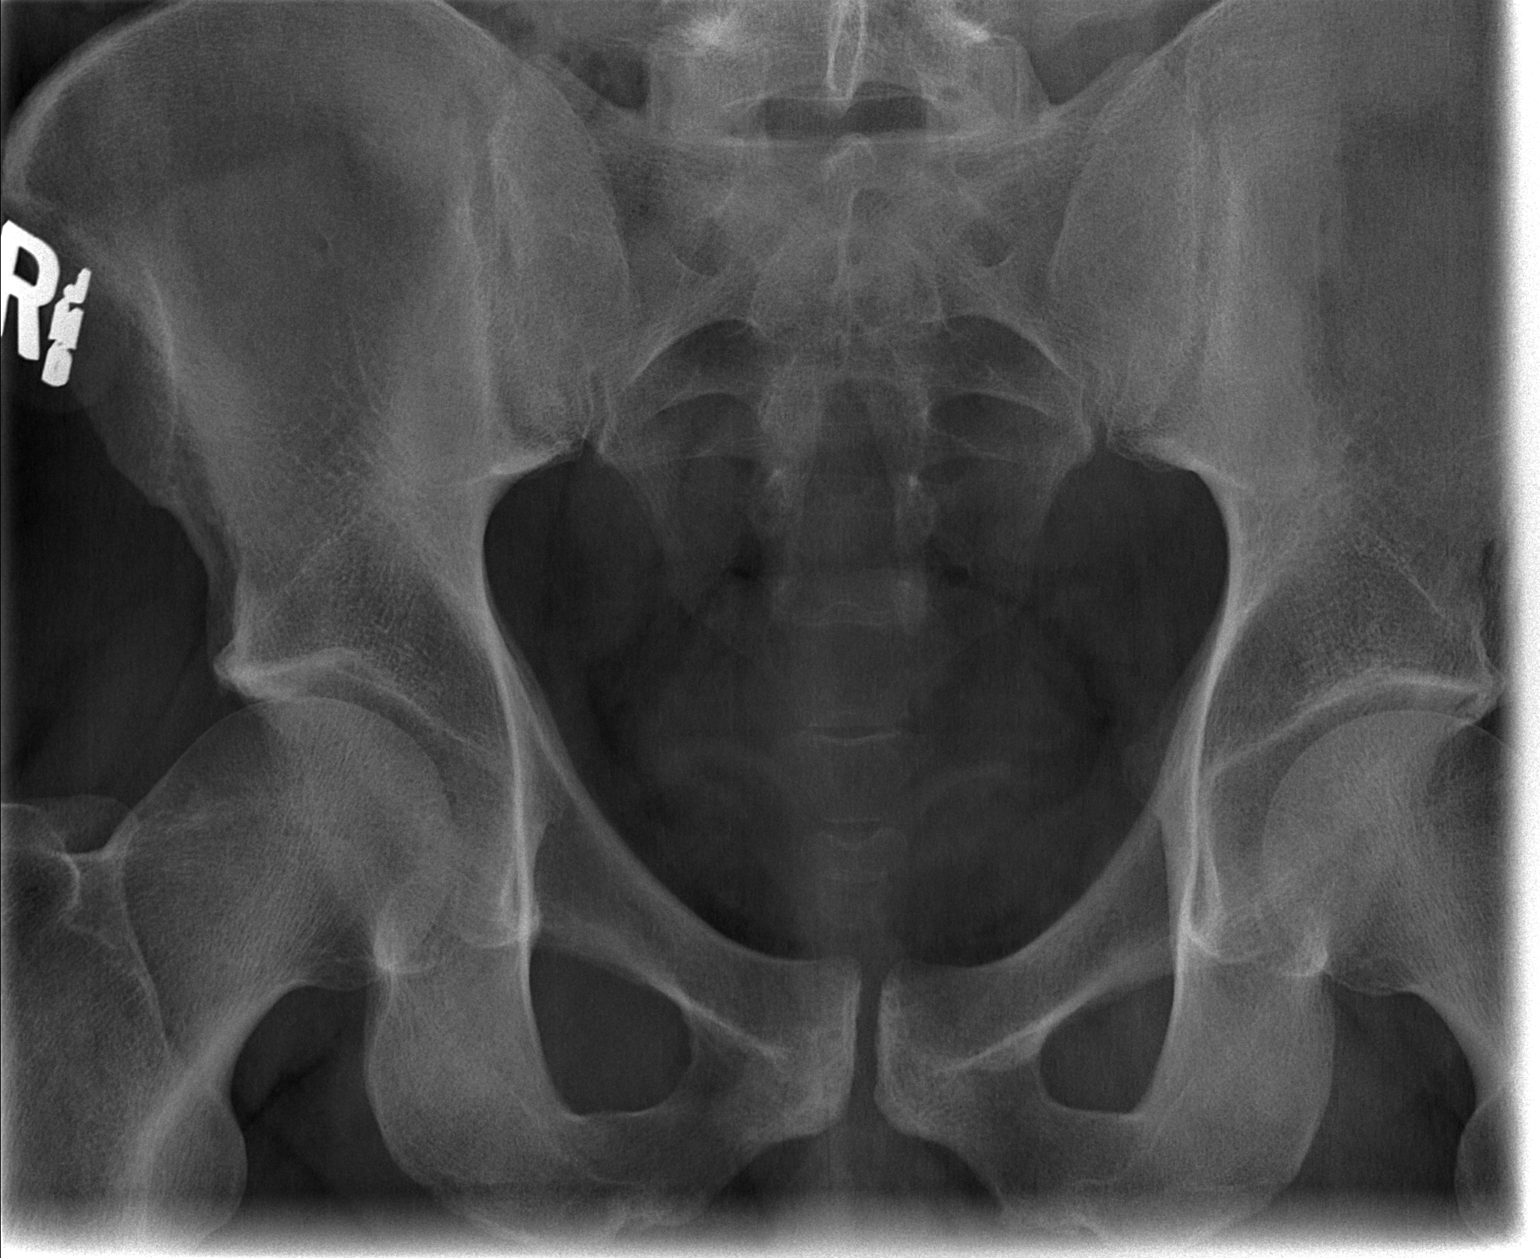

[2 of 2 positions shown; findings below may reference images not displayed]

FINDINGS: The bowel gas pattern is normal.

Few mm calcific density overlies the expected location of the
proximal left ureter, unchanged in position.
IMPRESSION: Unchanged position of the left ureteral calculus.

## 2023-04-28 ENCOUNTER — Inpatient Hospital Stay (HOSPITAL_COMMUNITY): Payer: 59

## 2023-04-28 ENCOUNTER — Emergency Department (HOSPITAL_BASED_OUTPATIENT_CLINIC_OR_DEPARTMENT_OTHER): Payer: 59

## 2023-04-28 ENCOUNTER — Inpatient Hospital Stay (HOSPITAL_BASED_OUTPATIENT_CLINIC_OR_DEPARTMENT_OTHER)
Admission: EM | Admit: 2023-04-28 | Discharge: 2023-04-29 | DRG: 322 | Disposition: A | Discharge status: Home / Self Care | Payer: 59 | Attending: Cardiovascular Disease | Admitting: Cardiovascular Disease

## 2023-04-28 ENCOUNTER — Inpatient Hospital Stay (HOSPITAL_COMMUNITY)
Admission: EM | Disposition: A | Discharge status: Home / Self Care | Payer: Self-pay | Source: Home / Self Care | Attending: Cardiovascular Disease

## 2023-04-28 ENCOUNTER — Other Ambulatory Visit: Payer: Self-pay

## 2023-04-28 ENCOUNTER — Encounter (HOSPITAL_BASED_OUTPATIENT_CLINIC_OR_DEPARTMENT_OTHER): Payer: Self-pay

## 2023-04-28 DIAGNOSIS — I213 ST elevation (STEMI) myocardial infarction of unspecified site: Secondary | ICD-10-CM

## 2023-04-28 DIAGNOSIS — G4733 Obstructive sleep apnea (adult) (pediatric): Secondary | ICD-10-CM | POA: Diagnosis present

## 2023-04-28 DIAGNOSIS — R079 Chest pain, unspecified: Secondary | ICD-10-CM | POA: Diagnosis not present

## 2023-04-28 DIAGNOSIS — E782 Mixed hyperlipidemia: Secondary | ICD-10-CM | POA: Diagnosis present

## 2023-04-28 DIAGNOSIS — G4730 Sleep apnea, unspecified: Secondary | ICD-10-CM | POA: Insufficient documentation

## 2023-04-28 DIAGNOSIS — I2102 ST elevation (STEMI) myocardial infarction involving left anterior descending coronary artery: Secondary | ICD-10-CM | POA: Diagnosis not present

## 2023-04-28 DIAGNOSIS — I2510 Atherosclerotic heart disease of native coronary artery without angina pectoris: Secondary | ICD-10-CM | POA: Diagnosis present

## 2023-04-28 DIAGNOSIS — Z7982 Long term (current) use of aspirin: Secondary | ICD-10-CM

## 2023-04-28 DIAGNOSIS — Z79899 Other long term (current) drug therapy: Secondary | ICD-10-CM | POA: Diagnosis not present

## 2023-04-28 DIAGNOSIS — I10 Essential (primary) hypertension: Secondary | ICD-10-CM | POA: Diagnosis present

## 2023-04-28 DIAGNOSIS — E785 Hyperlipidemia, unspecified: Secondary | ICD-10-CM | POA: Insufficient documentation

## 2023-04-28 DIAGNOSIS — I2109 ST elevation (STEMI) myocardial infarction involving other coronary artery of anterior wall: Principal | ICD-10-CM | POA: Diagnosis present

## 2023-04-28 HISTORY — PX: CORONARY/GRAFT ACUTE MI REVASCULARIZATION: CATH118305

## 2023-04-28 HISTORY — PX: LEFT HEART CATH AND CORONARY ANGIOGRAPHY: CATH118249

## 2023-04-28 LAB — CBC
HCT: 52.8 % — ABNORMAL HIGH (ref 39.0–52.0)
HCT: 45.8 % (ref 39.0–52.0)
Hemoglobin: 18 g/dL — ABNORMAL HIGH (ref 13.0–17.0)
Hemoglobin: 15.8 g/dL (ref 13.0–17.0)
MCH: 28.6 pg (ref 26.0–34.0)
MCH: 28.2 pg (ref 26.0–34.0)
MCHC: 34.1 g/dL (ref 30.0–36.0)
MCHC: 34.5 g/dL (ref 30.0–36.0)
MCV: 83.8 fL (ref 80.0–100.0)
MCV: 81.6 fL (ref 80.0–100.0)
Platelets: 297 10*3/uL (ref 150–400)
Platelets: 268 10*3/uL (ref 150–400)
RBC: 6.3 MIL/uL — ABNORMAL HIGH (ref 4.22–5.81)
RBC: 5.61 MIL/uL (ref 4.22–5.81)
RDW: 14 % (ref 11.5–15.5)
RDW: 13.8 % (ref 11.5–15.5)
WBC: 9.9 10*3/uL (ref 4.0–10.5)
WBC: 10.1 10*3/uL (ref 4.0–10.5)
nRBC: 0 % (ref 0.0–0.2)
nRBC: 0 % (ref 0.0–0.2)

## 2023-04-28 LAB — CBC WITH DIFFERENTIAL/PLATELET
Abs Immature Granulocytes: 0.09 10*3/uL — ABNORMAL HIGH (ref 0.00–0.07)
Basophils Absolute: 0.1 10*3/uL (ref 0.0–0.1)
Basophils Relative: 1 %
Eosinophils Absolute: 0.3 10*3/uL (ref 0.0–0.5)
Eosinophils Relative: 3 %
HCT: 48 % (ref 39.0–52.0)
Hemoglobin: 16.4 g/dL (ref 13.0–17.0)
Immature Granulocytes: 1 %
Lymphocytes Relative: 17 %
Lymphs Abs: 1.5 10*3/uL (ref 0.7–4.0)
MCH: 28.5 pg (ref 26.0–34.0)
MCHC: 34.2 g/dL (ref 30.0–36.0)
MCV: 83.5 fL (ref 80.0–100.0)
Monocytes Absolute: 0.5 10*3/uL (ref 0.1–1.0)
Monocytes Relative: 6 %
Neutro Abs: 6.6 10*3/uL (ref 1.7–7.7)
Neutrophils Relative %: 72 %
Platelets: 263 10*3/uL (ref 150–400)
RBC: 5.75 MIL/uL (ref 4.22–5.81)
RDW: 13.8 % (ref 11.5–15.5)
WBC: 9 10*3/uL (ref 4.0–10.5)
nRBC: 0 % (ref 0.0–0.2)

## 2023-04-28 LAB — COMPREHENSIVE METABOLIC PANEL
ALT: 43 U/L (ref 0–44)
AST: 29 U/L (ref 15–41)
Albumin: 3.8 g/dL (ref 3.5–5.0)
Alkaline Phosphatase: 41 U/L (ref 38–126)
Anion gap: 11 (ref 5–15)
BUN: 18 mg/dL (ref 6–20)
CO2: 21 mmol/L — ABNORMAL LOW (ref 22–32)
Calcium: 9.3 mg/dL (ref 8.9–10.3)
Chloride: 106 mmol/L (ref 98–111)
Creatinine, Ser: 0.92 mg/dL (ref 0.61–1.24)
GFR, Estimated: >60 mL/min (ref >60)
Glucose, Bld: 108 mg/dL — ABNORMAL HIGH (ref 70–99)
Potassium: 3.3 mmol/L — ABNORMAL LOW (ref 3.5–5.1)
Sodium: 138 mmol/L (ref 135–145)
Total Bilirubin: 0.9 mg/dL (ref <1.2)
Total Protein: 6.5 g/dL (ref 6.5–8.1)

## 2023-04-28 LAB — I-STAT CHEM 8, ED
BUN: 19 mg/dL (ref 6–20)
Calcium, Ion: 1.2 mmol/L (ref 1.15–1.40)
Chloride: 103 mmol/L (ref 98–111)
Creatinine, Ser: 1.2 mg/dL (ref 0.61–1.24)
Glucose, Bld: 99 mg/dL (ref 70–99)
HCT: 53 % — ABNORMAL HIGH (ref 39.0–52.0)
Hemoglobin: 18 g/dL — ABNORMAL HIGH (ref 13.0–17.0)
Potassium: 3.5 mmol/L (ref 3.5–5.1)
Sodium: 141 mmol/L (ref 135–145)
TCO2: 25 mmol/L (ref 22–32)

## 2023-04-28 LAB — CREATININE, SERUM
Creatinine, Ser: 1.06 mg/dL (ref 0.61–1.24)
GFR, Estimated: >60 mL/min (ref >60)

## 2023-04-28 LAB — LIPID PANEL
Cholesterol: 266 mg/dL — ABNORMAL HIGH (ref 0–200)
Cholesterol: 241 mg/dL — ABNORMAL HIGH (ref 0–200)
HDL: 45 mg/dL (ref >40)
HDL: 41 mg/dL (ref >40)
LDL Cholesterol: 148 mg/dL — ABNORMAL HIGH (ref 0–99)
LDL Cholesterol: 162 mg/dL — ABNORMAL HIGH (ref 0–99)
Total CHOL/HDL Ratio: 5.9 {ratio}
Total CHOL/HDL Ratio: 5.9 {ratio}
Triglycerides: 365 mg/dL — ABNORMAL HIGH (ref <150)
Triglycerides: 190 mg/dL — ABNORMAL HIGH (ref <150)
VLDL: 73 mg/dL — ABNORMAL HIGH (ref 0–40)
VLDL: 38 mg/dL (ref 0–40)

## 2023-04-28 LAB — HEPATIC FUNCTION PANEL
ALT: 47 U/L — ABNORMAL HIGH (ref 0–44)
AST: 29 U/L (ref 15–41)
Albumin: 4.4 g/dL (ref 3.5–5.0)
Alkaline Phosphatase: 47 U/L (ref 38–126)
Bilirubin, Direct: 0.1 mg/dL (ref 0.0–0.2)
Indirect Bilirubin: 0.7 mg/dL (ref 0.3–0.9)
Total Bilirubin: 0.8 mg/dL (ref <1.2)
Total Protein: 7.9 g/dL (ref 6.5–8.1)

## 2023-04-28 LAB — PROTIME-INR
INR: 1 (ref 0.8–1.2)
INR: 1.1 (ref 0.8–1.2)
Prothrombin Time: 12.9 s (ref 11.4–15.2)
Prothrombin Time: 14.2 s (ref 11.4–15.2)

## 2023-04-28 LAB — TROPONIN I (HIGH SENSITIVITY)
Troponin I (High Sensitivity): 16 ng/L (ref <18)
Troponin I (High Sensitivity): 56 ng/L — ABNORMAL HIGH (ref <18)

## 2023-04-28 LAB — POCT ACTIVATED CLOTTING TIME: Activated Clotting Time: 343 s

## 2023-04-28 LAB — ECHOCARDIOGRAM COMPLETE
AR max vel: 3.34 cm2
AV Area VTI: 2.92 cm2
AV Area mean vel: 2.8 cm2
AV Mean grad: 3.3 mm[Hg]
AV Peak grad: 4.8 mm[Hg]
Ao pk vel: 1.1 m/s
Area-P 1/2: 3.21 cm2
Height: 67 in
S' Lateral: 2.3 cm
Weight: 3633.18 [oz_av]

## 2023-04-28 LAB — HEMOGLOBIN A1C
Hgb A1c MFr Bld: 5.2 % (ref 4.8–5.6)
Hgb A1c MFr Bld: 5.3 % (ref 4.8–5.6)
Mean Plasma Glucose: 102.54 mg/dL
Mean Plasma Glucose: 105.41 mg/dL

## 2023-04-28 LAB — APTT
aPTT: 26 s (ref 24–36)
aPTT: 66 s — ABNORMAL HIGH (ref 24–36)

## 2023-04-28 LAB — MRSA NEXT GEN BY PCR, NASAL: MRSA by PCR Next Gen: NOT DETECTED

## 2023-04-28 LAB — HIV ANTIBODY (ROUTINE TESTING W REFLEX): HIV Screen 4th Generation wRfx: NONREACTIVE

## 2023-04-28 LAB — CG4 I-STAT (LACTIC ACID): Lactic Acid, Venous: 0.8 mmol/L (ref 0.5–1.9)

## 2023-04-28 SURGERY — CORONARY/GRAFT ACUTE MI REVASCULARIZATION
Anesthesia: LOCAL

## 2023-04-28 MED ORDER — HYDRALAZINE HCL 20 MG/ML IJ SOLN
10.0000 mg | INTRAMUSCULAR | Status: AC | PRN
Start: 2023-04-28 — End: 2023-04-28

## 2023-04-28 MED ORDER — LIDOCAINE HCL (PF) 1 % IJ SOLN
INTRAMUSCULAR | Status: AC
  Filled 2023-04-28: qty 30

## 2023-04-28 MED ORDER — ENOXAPARIN SODIUM 40 MG/0.4ML IJ SOSY
40.0000 mg | PREFILLED_SYRINGE | INTRAMUSCULAR | Status: DC
  Administered 2023-04-29: 40 mg via SUBCUTANEOUS
  Filled 2023-04-28: qty 0.4

## 2023-04-28 MED ORDER — NITROGLYCERIN 0.4 MG SL SUBL: 0.4000 mg | SUBLINGUAL_TABLET | SUBLINGUAL | Status: DC | PRN

## 2023-04-28 MED ORDER — HEPARIN SODIUM (PORCINE) 1000 UNIT/ML IJ SOLN
INTRAMUSCULAR | Status: DC | PRN
  Administered 2023-04-28: 10000 [IU] via INTRAVENOUS

## 2023-04-28 MED ORDER — PRASUGREL HCL 10 MG PO TABS
10.0000 mg | ORAL_TABLET | Freq: Every day | ORAL | Status: DC
  Administered 2023-04-29: 10 mg via ORAL
  Filled 2023-04-28: qty 1

## 2023-04-28 MED ORDER — HEPARIN SODIUM (PORCINE) 5000 UNIT/ML IJ SOLN
4000.0000 [IU] | Freq: Once | INTRAMUSCULAR | Status: AC
  Administered 2023-04-28: 4000 [IU] via INTRAVENOUS
  Filled 2023-04-28: qty 1

## 2023-04-28 MED ORDER — ASPIRIN 81 MG PO TBEC
81.0000 mg | DELAYED_RELEASE_TABLET | Freq: Every day | ORAL | Status: DC
  Administered 2023-04-29: 81 mg via ORAL
  Filled 2023-04-28: qty 1

## 2023-04-28 MED ORDER — NITROGLYCERIN 0.4 MG SL SUBL
SUBLINGUAL_TABLET | SUBLINGUAL | Status: AC
  Administered 2023-04-28: 0.4 mg
  Filled 2023-04-28: qty 1

## 2023-04-28 MED ORDER — IOHEXOL 350 MG/ML SOLN
INTRAVENOUS | Status: DC | PRN
  Administered 2023-04-28: 115 mL via INTRA_ARTERIAL

## 2023-04-28 MED ORDER — HEPARIN SODIUM (PORCINE) 1000 UNIT/ML IJ SOLN
INTRAMUSCULAR | Status: AC
  Filled 2023-04-28: qty 10

## 2023-04-28 MED ORDER — FENTANYL CITRATE (PF) 100 MCG/2ML IJ SOLN
INTRAMUSCULAR | Status: DC | PRN
  Administered 2023-04-28: 25 ug via INTRAVENOUS

## 2023-04-28 MED ORDER — SODIUM CHLORIDE 0.9 % IV SOLN: 250.0000 mL | INTRAVENOUS | Status: DC | PRN

## 2023-04-28 MED ORDER — SODIUM CHLORIDE 0.9% FLUSH
3.0000 mL | Freq: Two times a day (BID) | INTRAVENOUS | Status: DC
Start: 2023-04-28 — End: 2023-04-29
  Administered 2023-04-28 – 2023-04-29 (×2): 3 mL via INTRAVENOUS

## 2023-04-28 MED ORDER — MIDAZOLAM HCL 2 MG/2ML IJ SOLN
INTRAMUSCULAR | Status: DC | PRN
  Administered 2023-04-28: 2 mg via INTRAVENOUS

## 2023-04-28 MED ORDER — LABETALOL HCL 5 MG/ML IV SOLN: 10.0000 mg | INTRAVENOUS | Status: AC | PRN

## 2023-04-28 MED ORDER — ASPIRIN 81 MG PO CHEW
324.0000 mg | CHEWABLE_TABLET | Freq: Once | ORAL | Status: AC
Start: 2023-04-28 — End: 2023-04-28
  Administered 2023-04-28: 324 mg via ORAL
  Filled 2023-04-28: qty 4

## 2023-04-28 MED ORDER — ESCITALOPRAM OXALATE 10 MG PO TABS
20.0000 mg | ORAL_TABLET | Freq: Every day | ORAL | Status: DC
  Administered 2023-04-28 – 2023-04-29 (×2): 20 mg via ORAL
  Filled 2023-04-28 (×2): qty 2

## 2023-04-28 MED ORDER — ACETAMINOPHEN 325 MG PO TABS: 650.0000 mg | ORAL_TABLET | ORAL | Status: DC | PRN

## 2023-04-28 MED ORDER — FENTANYL CITRATE (PF) 100 MCG/2ML IJ SOLN
INTRAMUSCULAR | Status: AC
  Filled 2023-04-28: qty 2

## 2023-04-28 MED ORDER — ONDANSETRON HCL 4 MG/2ML IJ SOLN: 4.0000 mg | Freq: Four times a day (QID) | INTRAMUSCULAR | Status: DC | PRN

## 2023-04-28 MED ORDER — VERAPAMIL HCL 2.5 MG/ML IV SOLN
INTRAVENOUS | Status: DC | PRN
  Administered 2023-04-28: 10 mL via INTRA_ARTERIAL

## 2023-04-28 MED ORDER — ONDANSETRON HCL 4 MG/2ML IJ SOLN
INTRAMUSCULAR | Status: AC
  Filled 2023-04-28: qty 2

## 2023-04-28 MED ORDER — ONDANSETRON HCL 4 MG/2ML IJ SOLN
4.0000 mg | Freq: Once | INTRAMUSCULAR | Status: AC
Start: 2023-04-28 — End: 2023-04-28
  Administered 2023-04-28: 4 mg via INTRAVENOUS

## 2023-04-28 MED ORDER — OXYCODONE HCL 5 MG PO TABS
5.0000 mg | ORAL_TABLET | ORAL | Status: DC | PRN
Start: 2023-04-28 — End: 2023-04-29

## 2023-04-28 MED ORDER — SODIUM CHLORIDE 0.9 % IV SOLN: INTRAVENOUS | Status: DC

## 2023-04-28 MED ORDER — LIDOCAINE HCL (PF) 1 % IJ SOLN
INTRAMUSCULAR | Status: DC | PRN
  Administered 2023-04-28: 2 mL

## 2023-04-28 MED ORDER — SODIUM CHLORIDE 0.9 % IV SOLN: INTRAVENOUS | Status: AC

## 2023-04-28 MED ORDER — MIDAZOLAM HCL 2 MG/2ML IJ SOLN
INTRAMUSCULAR | Status: AC
Start: 2023-04-28 — End: ?
  Filled 2023-04-28: qty 2

## 2023-04-28 MED ORDER — ATORVASTATIN CALCIUM 80 MG PO TABS
80.0000 mg | ORAL_TABLET | Freq: Every day | ORAL | Status: DC
  Administered 2023-04-28 – 2023-04-29 (×2): 80 mg via ORAL
  Filled 2023-04-28 (×2): qty 1

## 2023-04-28 MED ORDER — HEPARIN (PORCINE) IN NACL 1000-0.9 UT/500ML-% IV SOLN
INTRAVENOUS | Status: DC | PRN
  Administered 2023-04-28 (×2): 500 mL

## 2023-04-28 MED ORDER — PRASUGREL HCL 10 MG PO TABS
ORAL_TABLET | ORAL | Status: DC | PRN
  Administered 2023-04-28: 60 mg via ORAL

## 2023-04-28 MED ORDER — SODIUM CHLORIDE 0.9% FLUSH: 3.0000 mL | INTRAVENOUS | Status: DC | PRN

## 2023-04-28 MED ORDER — VERAPAMIL HCL 2.5 MG/ML IV SOLN
INTRAVENOUS | Status: AC
  Filled 2023-04-28: qty 2

## 2023-04-28 MED ORDER — DIAZEPAM 5 MG PO TABS: 5.0000 mg | ORAL_TABLET | Freq: Three times a day (TID) | ORAL | Status: DC | PRN

## 2023-04-28 SURGICAL SUPPLY — 19 items
BALLN EMERGE MR 2.5X15 (BALLOONS) ×1
BALLN ~~LOC~~ EMERGE MR 4.5X12 (BALLOONS) ×1
BALLOON EMERGE MR 2.5X15 (BALLOONS) IMPLANT
BALLOON ~~LOC~~ EMERGE MR 4.5X12 (BALLOONS) IMPLANT
CATH 5FR JL3.5 JR4 ANG PIG MP (CATHETERS) IMPLANT
CATH VISTA GUIDE 6FR XBLAD3.5 (CATHETERS) IMPLANT
DEVICE RAD COMP TR BAND LRG (VASCULAR PRODUCTS) IMPLANT
GLIDESHEATH SLEND SS 6F .021 (SHEATH) IMPLANT
GUIDEWIRE INQWIRE 1.5J.035X260 (WIRE) IMPLANT
INQWIRE 1.5J .035X260CM (WIRE) ×1
KIT ENCORE 26 ADVANTAGE (KITS) IMPLANT
KIT HEMO VALVE WATCHDOG (MISCELLANEOUS) IMPLANT
KIT SYRINGE INJ CVI SPIKEX1 (MISCELLANEOUS) IMPLANT
PACK CARDIAC CATHETERIZATION (CUSTOM PROCEDURE TRAY) ×2 IMPLANT
SET ATX-X65L (MISCELLANEOUS) IMPLANT
STENT SYNERGY XD 4.0X16 (Permanent Stent) IMPLANT
SYNERGY XD 4.0X16 (Permanent Stent) ×1 IMPLANT
WIRE ASAHI PROWATER 180CM (WIRE) IMPLANT
WIRE HI TORQ WHISPER MS 190CM (WIRE) IMPLANT

## 2023-04-28 NOTE — H&P (Signed)
Cardiology Admission History and Physical   Patient ID: Christian Foster MRN: [id number]; DOB: 06/02/1970   Admission date: 04/28/2023  PCP:  Loyal Jacobson, MD   [facility] Providers Cardiologist:  None        Chief Complaint:  Chest pain  Patient Profile:   Christian Foster is a 52 y.o. male with no past cardiac history who is being seen 04/28/2023 for the evaluation of chest pain/STEMI.  History of Present Illness:   Christian Foster presented to [facility] with sudden onset of chest pain.  He developed substernal chest discomfort which he described as being very severe.  This came on suddenly while he was watching football.  Within 30 minutes of symptom onset, he went to the [facility] emergency department which is very close to his home.  He was immediately diagnosed with a STEMI, administered aspirin and IV heparin, and transferred emergently for cardiac catheterization and possible PCI.  He has no past history of cardiac disease.  He does have a history of hypertension.  He has been treated with hydrochlorothiazide.  He is a non-smoker.  He has been diagnosed with hyperlipidemia but deferred on statin therapy in an effort to improve with lifestyle modification.  There is no associated shortness of breath, orthopnea, PND, or diaphoresis.  No nausea or vomiting.  On arrival here, his pain has improved significantly.  He describes 2/10 ongoing substernal chest discomfort.   Past Medical History:  Diagnosis Date   History of kidney stones    Sleep apnea    Pt states he was told after last surgery that he needed to be tested for sleep apnea. stated he "stopped breathing and had to be bagged" Pt has not had follow up testing    Past Surgical History:  Procedure Laterality Date   APPENDECTOMY     35 yrs ago   EAR CYST EXCISION Right 11/26/2014   Procedure: EXCISION OF RIGHT ARM SEBACEOUS CYST;  Surgeon: Abigail Miyamoto, MD;  Location: [facility];  Service: General;  Laterality: Right;   EXTRACORPOREAL SHOCK WAVE LITHOTRIPSY Left 02/04/2018   Procedure: LEFT EXTRACORPOREAL SHOCK WAVE LITHOTRIPSY (ESWL);  Surgeon: Crista Elliot, MD;  Location: [facility];  Service: Urology;  Laterality: Left;   KNEE SURGERY Right 1991   PATELLAR TENDON REPAIR Left feb 2015   WISDOM TOOTH EXTRACTION Bilateral 1992   all 4 removed     Medications Prior to Admission: Prior to Admission medications   Medication Sig Start Date End Date Taking? Authorizing Provider  amphetamine-dextroamphetamine (ADDERALL XR) 20 MG 24 hr capsule Take 20 mg by mouth daily.    [provider]  cephALEXin (KEFLEX) 500 MG capsule Take 1 capsule (500 mg total) by mouth 3 (three) times daily. To prevent infection 10/23/15   Trixie Dredge, PA-C  doxycycline (VIBRA-TABS) 100 MG tablet Take 1 tablet (100 mg total) by mouth 2 (two) times daily. 11/26/14   Abigail Miyamoto, MD  escitalopram (LEXAPRO) 20 MG tablet Take 20 mg by mouth daily.    [provider]  HYDROcodone-acetaminophen (NORCO) 5-325 MG per tablet Take 1-2 tablets by mouth every 4 (four) hours as needed. 11/26/14   Abigail Miyamoto, MD  oxyCODONE-acetaminophen (PERCOCET/ROXICET) 5-325 MG tablet Take 1-2 tablets by mouth as needed for severe pain.    [provider]  sertraline (ZOLOFT) 50 MG tablet Take 50 mg by mouth daily.    [provider]  tamsulosin (FLOMAX) 0.4 MG  CAPS capsule Take 0.4 mg by mouth.    [provider]     Allergies:   No Known Allergies  Social History:   Social History   Socioeconomic History   Marital status: Married    Spouse name: Not on file   Number of children: Not on file   Years of education: Not on file   Highest education level: Not on file  Occupational History   Not on file  Tobacco Use   Smoking status: Never   Smokeless tobacco: Never  Vaping Use   Vaping status: Never Used  Substance and Sexual Activity   Alcohol use:  Yes    Comment: ocassional   Drug use: No   Sexual activity: Not on file  Other Topics Concern   Not on file  Social History Narrative   Not on file   Social Determinants of Health   Financial Resource Strain: Not on file  Food Insecurity: Low Risk  (03/13/2023)   Received from [facility]   Hunger Vital Sign    Worried About Running Out of Food in the Last Year: Never true    Ran Out of Food in the Last Year: Never true  Transportation Needs: No Transportation Needs (03/13/2023)   Received from [facility]    In the past 12 months, has lack of reliable transportation kept you from medical appointments, meetings, work or from getting things needed for daily living? : No  Physical Activity: Not on file  Stress: Not on file  Social Connections: Not on file  Intimate Partner Violence: Not on file    Family History:   The patient's family history is not on file.    ROS:  Please see the history of present illness.  All other ROS reviewed and negative.     Physical Exam/Data:   Vitals:   04/28/23 1441 04/28/23 1446 04/28/23 1451 04/28/23 1456  BP: 106/84 115/75 112/74 113/72  Pulse: 84 82 86 82  Resp: 12 16 17 12   Temp:      TempSrc:      SpO2: 95% 95% 95% 95%  Weight:      Height:       No intake or output data in the 24 hours ending 04/28/23 1536    04/28/2023    1:07 PM 02/04/2018    8:03 AM 10/23/2015    1:26 PM  Last 3 Weights  Weight (lbs) 227 lb 1.2 oz 227 lb 233 lb  Weight (kg) 103 kg 102.967 kg 105.688 kg     Body mass index is 35.56 kg/m.  General:  Well nourished, well developed, in no acute distress HEENT: normal Neck: no JVD Vascular: No carotid bruits; Distal pulses 2+ bilaterally   Cardiac:  normal S1, S2; RRR; no murmur  Lungs:  clear to auscultation bilaterally, no wheezing, rhonchi or rales  Abd: soft, nontender, no hepatomegaly  Ext: no edema Musculoskeletal:  No deformities, BUE and BLE strength normal and equal Skin:  warm and dry  Neuro:  CNs 2-12 intact, no focal abnormalities noted Psych:  Normal affect    EKG:  The ECG that was done at [facility] was personally reviewed and demonstrates NSR   Relevant CV Studies: pending  Laboratory Data:  High Sensitivity Troponin:   Recent Labs  Lab 04/28/23 1315  TROPONINIHS 16      Chemistry Recent Labs  Lab 04/28/23 1319  NA 141  K 3.5  CL 103  GLUCOSE 99  BUN 19  CREATININE 1.20    Recent Labs  Lab 04/28/23 1315  PROT 7.9  ALBUMIN 4.4  AST 29  ALT 47*  ALKPHOS 47  BILITOT 0.8   Lipids No results for input(s): "CHOL", "TRIG", "HDL", "LABVLDL", "LDLCALC", "CHOLHDL" in the last 168 hours. Hematology Recent Labs  Lab 04/28/23 1315 04/28/23 1319 04/28/23 1419  WBC 9.9  --  9.0  RBC 6.30*  --  5.75  HGB 18.0* 18.0* 16.4  HCT 52.8* 53.0* 48.0  MCV 83.8  --  83.5  MCH 28.6  --  28.5  MCHC 34.1  --  34.2  RDW 14.0  --  13.8  PLT 297  --  263   Thyroid No results for input(s): "TSH", "FREET4" in the last 168 hours. BNPNo results for input(s): "BNP", "PROBNP" in the last 168 hours.  DDimer No results for input(s): "DDIMER" in the last 168 hours.   Radiology/Studies:  DG Chest Port 1 View  Result Date: 04/28/2023 CLINICAL DATA:  52 year old male with history of chest pain. EXAM: PORTABLE CHEST 1 VIEW COMPARISON:  No priors. FINDINGS: Transcutaneous defibrillator pads project over the upper and lower thorax. Lung volumes are low. No consolidative airspace disease. No pleural effusions. No pneumothorax. No pulmonary nodule or mass noted. Pulmonary vasculature and the cardiomediastinal silhouette are within normal limits. IMPRESSION: 1. Low lung volumes without radiographic evidence of acute cardiopulmonary disease. Electronically Signed   By: Trudie Reed M.D.   On: 04/28/2023 13:39     Assessment and Plan:   Acute anterior STEMI Hypertension Mixed hyperlipidemia, untreated  The patient is received heparin 4000  units IV and aspirin 324 mg.  He will go for emergency cardiac catheterization and PCI.  He will likely require DAPT with aspirin and ticagrelor, high intensity statin drug, and post MI medical therapy.  Further plans/disposition pending his cardiac catheterization result.   Risk Assessment/Risk Scores:    TIMI Risk Score for ST  Elevation MI:   The patient's TIMI risk score is 1, which indicates a 1.6% risk of all cause mortality at 30 days.       Code Status: Full Code  Severity of Illness: The appropriate patient status for this patient is INPATIENT. Inpatient status is judged to be reasonable and necessary in order to provide the required intensity of service to ensure the patient's safety. The patient's presenting symptoms, physical exam findings, and initial radiographic and laboratory data in the context of their chronic comorbidities is felt to place them at high risk for further clinical deterioration. Furthermore, it is not anticipated that the patient will be medically stable for discharge from the hospital within 2 midnights of admission.   * I certify that at the point of admission it is my clinical judgment that the patient will require inpatient hospital care spanning beyond 2 midnights from the point of admission due to high intensity of service, high risk for further deterioration and high frequency of surveillance required.*   For questions or updates, please contact  [facility] Please consult [email or web address] for contact info under     Signed, Tonny Bollman, MD  04/28/2023 3:36 PM

## 2023-04-28 NOTE — ED Notes (Signed)
Chest shaved for EKG leads.

## 2023-04-28 NOTE — ED Notes (Signed)
Pt wheeled back to Rm14, ChargeRN and MD notified. ED Provider at bedside, PA and MD.

## 2023-04-28 NOTE — Progress Notes (Signed)
  Echocardiogram 2D Echocardiogram has been performed.  Delcie Roch 04/28/2023, 5:44 PM

## 2023-04-28 NOTE — ED Notes (Signed)
Pt has left [facility].

## 2023-04-28 NOTE — ED Notes (Signed)
ED Provider at bedside. 

## 2023-04-28 NOTE — ED Triage Notes (Signed)
The patient having chest pain that moves into his left arm. He stated it started 30 minute ago. He stated he has had some diaphoresis.  Marland Kitchen

## 2023-04-28 NOTE — ED Provider Notes (Signed)
[place] [facility] Provider Note   CSN: [id number] Arrival date & time: 04/28/23  1301     History  Chief Complaint  Patient presents with   Chest Pain    Geonni Stimpert is a 52 y.o. male.  Patient is a 52 year old male with a history of hypertension on hydrochlorothiazide and borderline hyperlipidemia without any tobacco use presenting today with sudden onset of severe chest pain approximately 30 minutes prior to arrival.  He reports sitting on the couch and watching football when the pain started.  He describes it as an excruciating crushing pain in the center of his chest that he feels a little bit in his back and his arm with some minimal shortness of breath and sweating with some nausea.  He denies any cough or congestion recently.  Had noticed over the last couple of weeks he has had some intermittent chest pain that he thought was related to indigestion because it improved with Tums.  He denies any prior history of cardiac disease.  No significant family history of cardiac disease.  The history is provided by the patient.  Chest Pain      Home Medications Prior to Admission medications   Medication Sig Start Date End Date Taking? Authorizing Provider  amphetamine-dextroamphetamine (ADDERALL XR) 20 MG 24 hr capsule Take 20 mg by mouth daily.    [provider]  cephALEXin (KEFLEX) 500 MG capsule Take 1 capsule (500 mg total) by mouth 3 (three) times daily. To prevent infection 10/23/15   Trixie Dredge, PA-C  doxycycline (VIBRA-TABS) 100 MG tablet Take 1 tablet (100 mg total) by mouth 2 (two) times daily. 11/26/14   Abigail Miyamoto, MD  escitalopram (LEXAPRO) 20 MG tablet Take 20 mg by mouth daily.    [provider]  HYDROcodone-acetaminophen (NORCO) 5-325 MG per tablet Take 1-2 tablets by mouth every 4 (four) hours as needed. 11/26/14   Abigail Miyamoto, MD  oxyCODONE-acetaminophen (PERCOCET/ROXICET) 5-325 MG tablet Take  1-2 tablets by mouth as needed for severe pain.    [provider]  sertraline (ZOLOFT) 50 MG tablet Take 50 mg by mouth daily.    [provider]  tamsulosin (FLOMAX) 0.4 MG CAPS capsule Take 0.4 mg by mouth.    [provider]      Allergies    Patient has no known allergies.    Review of Systems   Review of Systems  Cardiovascular:  Positive for chest pain.    Physical Exam Updated Vital Signs BP (!) 140/107   Pulse 80   Temp 98 F (36.7 C) (Oral)   Resp (!) 24   Ht 5\' 7"  (1.702 m)   Wt 103 kg   SpO2 100%   BMI 35.56 kg/m  Physical Exam Vitals and nursing note reviewed.  Constitutional:      General: He is not in acute distress.    Appearance: He is well-developed. He is ill-appearing and diaphoretic.  HENT:     Head: Normocephalic and atraumatic.  Eyes:     Conjunctiva/sclera: Conjunctivae normal.     Pupils: Pupils are equal, round, and reactive to light.  Cardiovascular:     Rate and Rhythm: Normal rate and regular rhythm.     Heart sounds: No murmur heard. Pulmonary:     Effort: Pulmonary effort is normal. No respiratory distress.     Breath sounds: Normal breath sounds. No wheezing or rales.  Abdominal:     General: There is no distension.  Palpations: Abdomen is soft.     Tenderness: There is no abdominal tenderness. There is no guarding or rebound.  Musculoskeletal:        General: No tenderness. Normal range of motion.     Cervical back: Normal range of motion and neck supple.     Right lower leg: No edema.     Left lower leg: No edema.  Skin:    General: Skin is warm.     Findings: No erythema or rash.  Neurological:     Mental Status: He is alert and oriented to person, place, and time. Mental status is at baseline.  Psychiatric:        Behavior: Behavior normal.     ED Results / Procedures / Treatments   Labs (all labs ordered are listed, but only abnormal results are displayed) Labs Reviewed  CBC  HEMOGLOBIN  A1C  PROTIME-INR  APTT  LIPID PANEL  HEPATIC FUNCTION PANEL  I-STAT CG4 LACTIC ACID, ED  I-STAT CHEM 8, ED  TROPONIN I (HIGH SENSITIVITY)    EKG None  Radiology No results found.  Procedures Procedures    Medications Ordered in ED Medications  0.9 %  sodium chloride infusion (has no administration in time range)  ondansetron (ZOFRAN) injection 4 mg (has no administration in time range)  aspirin chewable tablet 324 mg (324 mg Oral Given 04/28/23 1322)  heparin injection 4,000 Units (4,000 Units Intravenous Given 04/28/23 1322)  nitroGLYCERIN (NITROSTAT) 0.4 MG SL tablet (0.4 mg  Given 04/28/23 1320)    ED Course/ Medical Decision Making/ A&P                                 Medical Decision Making Amount and/or Complexity of Data Reviewed Labs: ordered. Radiology: ordered.  Risk Prescription drug management. Decision regarding hospitalization.   Pt with multiple medical problems and comorbidities and presenting today with a complaint that caries a high risk for morbidity and mortality.  Here today with sudden onset of chest pain, ill-appearing upon arrival.  I independently interpreted patient's EKG. patient had an EKG done upon arrival that shows ST elevation MI anteriorly and laterally.  Patient's vital signs are stable other than mild hypertension.  He was given aspirin, nitroglycerin, heparin.  STEMI was called.  Spoke with Dr. Excell Seltzer with cardiology and discussed the case.  Patient will go directly to the Cath Lab. After receiving nitroglycerin patient's pain has improved some.  Diaphoresis has stopped.  Blood pressures remained stable.  He is on continuous cardiac monitoring which I independently interpreted as a persistent sinus rhythm without ectopy.  I have independently visualized and interpreted pt's images today. Chest x-ray without acute findings.   CRITICAL CARE Performed by: Norma Ignasiak Total critical care time: 30 minutes Critical care time was  exclusive of separately billable procedures and treating other patients. Critical care was necessary to treat or prevent imminent or life-threatening deterioration. Critical care was time spent personally by me on the following activities: development of treatment plan with patient and/or surrogate as well as nursing, discussions with consultants, evaluation of patient's response to treatment, examination of patient, obtaining history from patient or surrogate, ordering and performing treatments and interventions, ordering and review of laboratory studies, ordering and review of radiographic studies, pulse oximetry and re-evaluation of patient's condition.          Final Clinical Impression(s) / ED Diagnoses Final diagnoses:  ST elevation myocardial infarction  involving left anterior descending (LAD) coronary artery [facility])    Rx / DC Orders ED Discharge Orders     None         Gwyneth Sprout, MD 04/28/23 1331

## 2023-04-28 NOTE — ED Notes (Signed)
Pain last couple weeks, but acutely worse the last 35 minutes before coming in. Central chest pain, radiates down L arm occasionally. No nausea or dizziness. Minor SOB but pt advised he's been hyperventilating because of 10/10 pain.   SL Nitroglycerin improved pain, but caused nausea. Zofran given for same. Pt pt reduced. Skin drying up, warm to touch. CAOx4.

## 2023-04-28 NOTE — ED Notes (Signed)
[facility] Called for transport at 1:25.

## 2023-04-29 ENCOUNTER — Telehealth: Payer: Self-pay | Admitting: Student

## 2023-04-29 ENCOUNTER — Encounter (HOSPITAL_COMMUNITY): Payer: Self-pay | Admitting: Cardiovascular Disease

## 2023-04-29 DIAGNOSIS — I2102 ST elevation (STEMI) myocardial infarction involving left anterior descending coronary artery: Secondary | ICD-10-CM | POA: Diagnosis not present

## 2023-04-29 DIAGNOSIS — E785 Hyperlipidemia, unspecified: Secondary | ICD-10-CM | POA: Insufficient documentation

## 2023-04-29 DIAGNOSIS — I2510 Atherosclerotic heart disease of native coronary artery without angina pectoris: Secondary | ICD-10-CM | POA: Insufficient documentation

## 2023-04-29 DIAGNOSIS — I10 Essential (primary) hypertension: Secondary | ICD-10-CM | POA: Insufficient documentation

## 2023-04-29 DIAGNOSIS — G4730 Sleep apnea, unspecified: Secondary | ICD-10-CM | POA: Insufficient documentation

## 2023-04-29 LAB — BASIC METABOLIC PANEL
Anion gap: 7 (ref 5–15)
BUN: 14 mg/dL (ref 6–20)
CO2: 23 mmol/L (ref 22–32)
Calcium: 8.9 mg/dL (ref 8.9–10.3)
Chloride: 110 mmol/L (ref 98–111)
Creatinine, Ser: 0.99 mg/dL (ref 0.61–1.24)
GFR, Estimated: >60 mL/min (ref >60)
Glucose, Bld: 96 mg/dL (ref 70–99)
Potassium: 3.6 mmol/L (ref 3.5–5.1)
Sodium: 140 mmol/L (ref 135–145)

## 2023-04-29 LAB — CBC
HCT: 45.6 % (ref 39.0–52.0)
Hemoglobin: 15.1 g/dL (ref 13.0–17.0)
MCH: 27.6 pg (ref 26.0–34.0)
MCHC: 33.1 g/dL (ref 30.0–36.0)
MCV: 83.4 fL (ref 80.0–100.0)
Platelets: 235 10*3/uL (ref 150–400)
RBC: 5.47 MIL/uL (ref 4.22–5.81)
RDW: 13.9 % (ref 11.5–15.5)
WBC: 8.2 10*3/uL (ref 4.0–10.5)
nRBC: 0 % (ref 0.0–0.2)

## 2023-04-29 MED ORDER — NITROGLYCERIN 0.4 MG SL SUBL: 0.4000 mg | SUBLINGUAL_TABLET | SUBLINGUAL | 0 refills | Status: AC | PRN

## 2023-04-29 MED ORDER — ASPIRIN 81 MG PO TBEC: 81.0000 mg | DELAYED_RELEASE_TABLET | Freq: Every day | ORAL | 11 refills | Status: DC

## 2023-04-29 MED ORDER — PRASUGREL HCL 10 MG PO TABS: 10.0000 mg | ORAL_TABLET | Freq: Every day | ORAL | 0 refills | Status: DC

## 2023-04-29 MED ORDER — ASPIRIN 81 MG PO TBEC: 81.0000 mg | DELAYED_RELEASE_TABLET | Freq: Every day | ORAL | 0 refills | Status: DC

## 2023-04-29 MED ORDER — NITROGLYCERIN 0.4 MG SL SUBL: 0.4000 mg | SUBLINGUAL_TABLET | SUBLINGUAL | 2 refills | Status: DC | PRN

## 2023-04-29 MED ORDER — ATORVASTATIN CALCIUM 80 MG PO TABS: 80.0000 mg | ORAL_TABLET | Freq: Every day | ORAL | 2 refills | Status: DC

## 2023-04-29 MED ORDER — PRASUGREL HCL 10 MG PO TABS: 10.0000 mg | ORAL_TABLET | Freq: Every day | ORAL | 11 refills | Status: DC

## 2023-04-29 MED ORDER — ATORVASTATIN CALCIUM 80 MG PO TABS: 80.0000 mg | ORAL_TABLET | Freq: Every day | ORAL | 0 refills | Status: DC

## 2023-04-29 MED FILL — Nitroglycerin IV Soln 100 MCG/ML in D5W: INTRA_ARTERIAL | Qty: 10 | Status: AC

## 2023-04-29 NOTE — Progress Notes (Signed)
Progress Note  Patient Name: Christian Foster Date of Encounter: 04/29/2023  Primary Cardiologist:   None   Subjective   No chest pain.  No SOB.   Inpatient Medications    Scheduled Meds:  aspirin EC  81 mg Oral Daily   atorvastatin  80 mg Oral Daily   enoxaparin (LOVENOX) injection  40 mg Subcutaneous Q24H   escitalopram  20 mg Oral Daily   prasugrel  10 mg Oral Daily   sodium chloride flush  3 mL Intravenous Q12H   Continuous Infusions:  PRN Meds: acetaminophen, diazepam, nitroGLYCERIN, ondansetron (ZOFRAN) IV, oxyCODONE, sodium chloride flush   Vital Signs    Vitals:   04/29/23 0400 04/29/23 0500 04/29/23 0600 04/29/23 0743  BP: 123/84 109/75 126/88   Pulse: 70 67 74   Resp: 11 17 18    Temp:    98.1 F (36.7 C)  TempSrc:    Oral  SpO2: 97% 94% 94%   Weight:      Height:        Intake/Output Summary (Last 24 hours) at 04/29/2023 0811 Last data filed at 04/28/2023 1930 Gross per 24 hour  Intake 512.5 ml  Output --  Net 512.5 ml   Filed Weights   04/28/23 1307  Weight: 103 kg    Telemetry    NSR with rare PVCs  - Personally Reviewed  ECG    NSR, short PR interval.  Non specific ST T wave flattening.   - Personally Reviewed  Physical Exam   GEN: No acute distress.   Neck: No  JVD Cardiac: RRR, no murmurs, rubs, or gallops.  Respiratory: Clear  to auscultation bilaterally. GI: Soft, nontender, non-distended  MS: No  edema; No deformity. Neuro:  Nonfocal  Psych: Normal affect   Labs    Chemistry Recent Labs  Lab 04/28/23 1315 04/28/23 1319 04/28/23 1319 04/28/23 1419 04/28/23 1830 04/29/23 0257  NA  --  141  --  138  --  140  K  --  3.5  --  3.3*  --  3.6  CL  --  103  --  106  --  110  CO2  --   --   --  21*  --  23  GLUCOSE  --  99  --  108*  --  96  BUN  --  19  --  18  --  14  CREATININE  --  1.20   < > 0.92 1.06 0.99  CALCIUM  --   --   --  9.3  --  8.9  PROT 7.9  --   --  6.5  --   --   ALBUMIN 4.4  --   --  3.8  --   --    AST 29  --   --  29  --   --   ALT 47*  --   --  43  --   --   ALKPHOS 47  --   --  41  --   --   BILITOT 0.8  --   --  0.9  --   --   GFRNONAA  --   --   --  >60 >60 >60  ANIONGAP  --   --   --  11  --  7   < > = values in this interval not displayed.     Hematology Recent Labs  Lab 04/28/23 1419 04/28/23 1830 04/29/23 0257  WBC 9.0 10.1 8.2  RBC 5.75 5.61 5.47  HGB 16.4 15.8 15.1  HCT 48.0 45.8 45.6  MCV 83.5 81.6 83.4  MCH 28.5 28.2 27.6  MCHC 34.2 34.5 33.1  RDW 13.8 13.8 13.9  PLT 263 268 235    Cardiac EnzymesNo results for input(s): "TROPONINI" in the last 168 hours. No results for input(s): "TROPIPOC" in the last 168 hours.   BNPNo results for input(s): "BNP", "PROBNP" in the last 168 hours.   DDimer No results for input(s): "DDIMER" in the last 168 hours.   Radiology    ECHOCARDIOGRAM COMPLETE  Result Date: 04/28/2023    ECHOCARDIOGRAM REPORT   Patient Name:   Christian Foster Date of Exam: 04/28/2023 Medical Rec #:  [id number]    Height:       67.0 in Accession #:    [id number]   Weight:       227.1 lb Date of Birth:  30-Sep-1970    BSA:          2.134 m Patient Age:    52 years     BP:           112/86 mmHg Patient Gender: M            HR:           73 bpm. Exam Location:  Inpatient Procedure: 2D Echo, Cardiac Doppler and Color Doppler Indications:    Chest pain  History:        Patient has no prior history of Echocardiogram examinations.  Sonographer:    Delcie Roch RDCS Referring Phys: [phone]  Sonographer Comments: Suboptimal subcostal window. IMPRESSIONS  1. Left ventricular ejection fraction, by estimation, is 70 to 75%. The left ventricle has hyperdynamic function. The left ventricle has no regional wall motion abnormalities. Left ventricular diastolic parameters are consistent with Grade I diastolic dysfunction (impaired relaxation).  2. Right ventricular systolic function is normal. The right ventricular size is normal. Tricuspid regurgitation  signal is inadequate for assessing PA pressure.  3. The mitral valve is normal in structure. No evidence of mitral valve regurgitation. No evidence of mitral stenosis.  4. The aortic valve is tricuspid. Aortic valve regurgitation is not visualized. No aortic stenosis is present. FINDINGS  Left Ventricle: Left ventricular ejection fraction, by estimation, is 70 to 75%. The left ventricle has hyperdynamic function. The left ventricle has no regional wall motion abnormalities. The left ventricular internal cavity size was normal in size. There is no left ventricular hypertrophy. Left ventricular diastolic parameters are consistent with Grade I diastolic dysfunction (impaired relaxation). Right Ventricle: The right ventricular size is normal. Right vetricular wall thickness was not well visualized. Right ventricular systolic function is normal. Tricuspid regurgitation signal is inadequate for assessing PA pressure. Left Atrium: Left atrial size was normal in size. Right Atrium: Right atrial size was normal in size. Pericardium: There is no evidence of pericardial effusion. Mitral Valve: The mitral valve is normal in structure. No evidence of mitral valve regurgitation. No evidence of mitral valve stenosis. Tricuspid Valve: The tricuspid valve is normal in structure. Tricuspid valve regurgitation is not demonstrated. No evidence of tricuspid stenosis. Aortic Valve: The aortic valve is tricuspid. Aortic valve regurgitation is not visualized. No aortic stenosis is present. Aortic valve mean gradient measures 3.3 mmHg. Aortic valve peak gradient measures 4.8 mmHg. Aortic valve area, by VTI measures 2.92 cm. Pulmonic Valve: The pulmonic valve was not well visualized. Pulmonic valve regurgitation is not visualized. No evidence of pulmonic stenosis. Aorta: The aortic root  and ascending aorta are structurally normal, with no evidence of dilitation. Venous: The inferior vena cava was not well visualized. IAS/Shunts: The  interatrial septum was not well visualized.  LEFT VENTRICLE PLAX 2D LVIDd:         4.50 cm   Diastology LVIDs:         2.30 cm   LV e' medial:    7.18 cm/s LV PW:         1.00 cm   LV E/e' medial:  6.7 LV IVS:        1.10 cm   LV e' lateral:   12.30 cm/s LVOT diam:     2.20 cm   LV E/e' lateral: 3.9 LV SV:         70 LV SV Index:   33 LVOT Area:     3.80 cm  RIGHT VENTRICLE RV Basal diam:  2.30 cm RV S prime:     30.30 cm/s TAPSE (M-mode): 2.2 cm LEFT ATRIUM             Index        RIGHT ATRIUM           Index LA diam:        3.60 cm 1.69 cm/m   RA Area:     15.00 cm LA Vol (A2C):   41.8 ml 19.58 ml/m  RA Volume:   34.40 ml  16.12 ml/m LA Vol (A4C):   44.2 ml 20.71 ml/m LA Biplane Vol: 45.0 ml 21.08 ml/m  AORTIC VALVE AV Area (Vmax):    3.34 cm AV Area (Vmean):   2.80 cm AV Area (VTI):     2.92 cm AV Vmax:           109.95 cm/s AV Vmean:          87.237 cm/s AV VTI:            0.240 m AV Peak Grad:      4.8 mmHg AV Mean Grad:      3.3 mmHg LVOT Vmax:         96.70 cm/s LVOT Vmean:        64.300 cm/s LVOT VTI:          0.184 m LVOT/AV VTI ratio: 0.77  AORTA Ao Root diam: 3.20 cm Ao Asc diam:  3.20 cm MITRAL VALVE MV Area (PHT): 3.21 cm    SHUNTS MV Decel Time: 236 msec    Systemic VTI:  0.18 m MV E velocity: 48.00 cm/s  Systemic Diam: 2.20 cm MV A velocity: 61.60 cm/s MV E/A ratio:  0.78 Dina Rich MD Electronically signed by Dina Rich MD Signature Date/Time: 04/28/2023/6:46:35 PM    Final    CARDIAC CATHETERIZATION  Result Date: 04/28/2023   Ost LAD to Prox LAD lesion is 95% stenosed.   A drug-eluting stent was successfully placed using a SYNERGY XD 4.0X16.   Post intervention, there is a 0% residual stenosis.   The left ventricular systolic function is normal.   LV end diastolic pressure is normal.   The left ventricular ejection fraction is 55-65% by visual estimate.   Recommend uninterrupted dual antiplatelet therapy with Aspirin 81mg  daily and Prasugrel 10mg  daily for a minimum of 12  months (ACS-Class I recommendation). 1.  Acute anterior STEMI, treated successfully with primary PCI using a 4.0 x 16 mm Synergy DES 2.  Patent left main, left circumflex, and dominant RCA with mild nonobstructive plaquing noted 3.  Normal LV function  with no regional wall motion abnormalities and LVEF estimated at 65% Post-MI medical therapy. ASA/prasugrel x 12 months without interruption. High-intensity statin Rx. If no complications arise, OK for DC tomorrow.   DG Chest Port 1 View  Result Date: 04/28/2023 CLINICAL DATA:  52 year old male with history of chest pain. EXAM: PORTABLE CHEST 1 VIEW COMPARISON:  No priors. FINDINGS: Transcutaneous defibrillator pads project over the upper and lower thorax. Lung volumes are low. No consolidative airspace disease. No pleural effusions. No pneumothorax. No pulmonary nodule or mass noted. Pulmonary vasculature and the cardiomediastinal silhouette are within normal limits. IMPRESSION: 1. Low lung volumes without radiographic evidence of acute cardiopulmonary disease. Electronically Signed   By: Trudie Reed M.D.   On: 04/28/2023 13:39    Cardiac Studies   Diagnostic Dominance: Right  Intervention    Patient Profile     52 y.o. male without prior cardiac history who presented with acute anterior MI.    Assessment & Plan    Acute anterior STEMI:  Post PCI as above.  Continued DAPT (with Effient) x 1 year.  EF 70 - 75% on echo.  No valvular abnormalities.     HTN:  BP OK.    Dyslipidemia:   LPa in process.  Home on Lipitor 80 mg.    Goal LDL OF 50.    Sleep apnea:  Oral appliance.     For questions or updates, please contact [facility] Please consult [email or web address] for contact info under Cardiology/STEMI.   Signed, Rollene Rotunda, MD  04/29/2023, 8:11 AM

## 2023-04-29 NOTE — Discharge Instructions (Addendum)
Medication Changes: - START Prasugrel (Effient) 10mg  daily and Aspirin 81mg  daily. These medications are very important in helping keep the new stent in your heart open. - START Atorvastatin (Lipitor) 80mg  daily.  - Recommend avoiding NSAIDS (Meloxicam, Aleve, Advil, Motrin, Ibuprofen) given heart disease. Recommended over-the-counter Tylenol instead as needed for pain.  A one month supply of your medications have been sent to the [facility]. I have also gone ahead and sent refills to your usual [facility].   Post STEMI: NO HEAVY LIFTING X 4 WEEKS. NO SEXUAL ACTIVITY X 4 WEEKS. NO DRIVING X 2 WEEKS. NO SOAKING BATHS, HOT TUBS, POOLS, ETC., X 7 DAYS.  Radial Site Care: Refer to this sheet in the next few weeks. These instructions provide you with information on caring for yourself after your procedure. Your caregiver may also give you more specific instructions. Your treatment has been planned according to current medical practices, but problems sometimes occur. Call your caregiver if you have any problems or questions after your procedure. HOME CARE INSTRUCTIONS You may shower the day after the procedure. Remove the bandage (dressing) and gently wash the site with plain soap and water. Gently pat the site dry.  Do not apply powder or lotion to the site.  Do not submerge the affected site in water for 3 to 5 days.  Inspect the site at least twice daily.  Do not flex or bend the affected arm for 24 hours.  No lifting over 5 pounds (2.3 kg) for 5 days after your procedure.  Do not drive home if you are discharged the same day of the procedure. Have someone else drive you.  What to expect: Any bruising will usually fade within 1 to 2 weeks.  Blood that collects in the tissue (hematoma) may be painful to the touch. It should usually decrease in size and tenderness within 1 to 2 weeks.  SEEK IMMEDIATE MEDICAL CARE IF: You have unusual pain at the radial site.  You have  redness, warmth, swelling, or pain at the radial site.  You have drainage (other than a small amount of blood on the dressing).  You have chills.  You have a fever or persistent symptoms for more than 72 hours.  You have a fever and your symptoms suddenly get worse.  Your arm becomes pale, cool, tingly, or numb.  You have heavy bleeding from the site. Hold pressure on the site.

## 2023-04-29 NOTE — Telephone Encounter (Signed)
   Transition of Care Follow-up Phone Call Request    Patient Name: Christian Foster Date of Birth: 04/18/1971 Date of Encounter: 04/29/2023  Primary Care Provider:  Loyal Jacobson, MD Primary Cardiologist:  None  [name] has been scheduled for a transition of care follow up appointment with a [facility] provider:  Marjie Skiff, PA-C, on 05/11/2023 at 2:45pm.  Please reach out to [facility] within 48 hours of discharge to confirm appointment and review transition of care protocol questionnaire. Anticipated discharge date: 04/29/2023.  Corrin Parker, PA-C  04/29/2023, 1:04 PM

## 2023-04-29 NOTE — Plan of Care (Signed)
  Problem: Education: Goal: Knowledge of General Education information will improve Description: Including pain rating scale, medication(s)/side effects and non-pharmacologic comfort measures Outcome: Progressing   Problem: Health Behavior/Discharge Planning: Goal: Ability to manage health-related needs will improve Outcome: Progressing   Problem: Clinical Measurements: Goal: Ability to maintain clinical measurements within normal limits will improve Outcome: Progressing Goal: Will remain free from infection Outcome: Progressing Goal: Diagnostic test results will improve Outcome: Progressing Goal: Respiratory complications will improve Outcome: Progressing Goal: Cardiovascular complication will be avoided Outcome: Progressing   Problem: Activity: Goal: Risk for activity intolerance will decrease Outcome: Progressing   Problem: Nutrition: Goal: Adequate nutrition will be maintained Outcome: Progressing   Problem: Coping: Goal: Level of anxiety will decrease Outcome: Progressing   Problem: Elimination: Goal: Will not experience complications related to bowel motility Outcome: Progressing Goal: Will not experience complications related to urinary retention Outcome: Progressing   Problem: Pain Management: Goal: General experience of comfort will improve Outcome: Progressing   Problem: Safety: Goal: Ability to remain free from injury will improve Outcome: Progressing   Problem: Skin Integrity: Goal: Risk for impaired skin integrity will decrease Outcome: Progressing   Problem: Education: Goal: Understanding of cardiac disease, CV risk reduction, and recovery process will improve Outcome: Progressing   Problem: Activity: Goal: Ability to tolerate increased activity will improve Outcome: Progressing   Problem: Cardiac: Goal: Ability to achieve and maintain adequate cardiovascular perfusion will improve Outcome: Progressing   Problem: Health Behavior/Discharge  Planning: Goal: Ability to safely manage health-related needs after discharge will improve Outcome: Progressing   Problem: Education: Goal: Understanding of CV disease, CV risk reduction, and recovery process will improve Outcome: Progressing   Problem: Activity: Goal: Ability to return to baseline activity level will improve Outcome: Progressing   Problem: Cardiovascular: Goal: Ability to achieve and maintain adequate cardiovascular perfusion will improve Outcome: Progressing Goal: Vascular access site(s) Level 0-1 will be maintained Outcome: Progressing   Problem: Health Behavior/Discharge Planning: Goal: Ability to safely manage health-related needs after discharge will improve Outcome: Progressing

## 2023-04-29 NOTE — Discharge Summary (Signed)
Discharge Summary    Patient ID: Christian Foster MRN: [id number]; DOB: Apr 19, 1971  Admit date: 04/28/2023 Discharge date: 04/29/2023  PCP:  Loyal Jacobson, MD   [facility] Providers Cardiologist:  New (Dr. Excell Seltzer)  Discharge Diagnoses    Principal Problem:   STEMI involving left anterior descending coronary artery [facility]) Active Problems:   CAD (coronary artery disease)   Hypertension   Hyperlipidemia   Sleep apnea    Diagnostic Studies/Procedures    Left Cardiac Catheterization 04/28/2023:   Ost LAD to Prox LAD lesion is 95% stenosed.   A drug-eluting stent was successfully placed using a SYNERGY XD 4.0X16.   Post intervention, there is a 0% residual stenosis.   The left ventricular systolic function is normal.   LV end diastolic pressure is normal.   The left ventricular ejection fraction is 55-65% by visual estimate.   Recommend uninterrupted dual antiplatelet therapy with Aspirin 81mg  daily and Prasugrel 10mg  daily for a minimum of 12 months (ACS-Class I recommendation).   1.  Acute anterior STEMI, treated successfully with primary PCI using a 4.0 x 16 mm Synergy DES 2.  Patent left main, left circumflex, and dominant RCA with mild nonobstructive plaquing noted 3.  Normal LV function with no regional wall motion abnormalities and LVEF estimated at 65%   Post-MI medical therapy. ASA/prasugrel x 12 months without interruption. High-intensity statin Rx. If no complications arise, OK for DC tomorrow.   Diagnostic Dominance: Right  Intervention     _____________  Echocardiogram 04/28/2023: Impressions: 1. Left ventricular ejection fraction, by estimation, is 70 to 75%. The  left ventricle has hyperdynamic function. The left ventricle has no  regional wall motion abnormalities. Left ventricular diastolic parameters  are consistent with Grade I diastolic  dysfunction (impaired relaxation).   2. Right ventricular systolic function is normal. The right  ventricular  size is normal. Tricuspid regurgitation signal is inadequate for assessing  PA pressure.   3. The mitral valve is normal in structure. No evidence of mitral valve  regurgitation. No evidence of mitral stenosis.   4. The aortic valve is tricuspid. Aortic valve regurgitation is not  visualized. No aortic stenosis is present.     History of Present Illness     Christian Foster is a 52 y.o. male with with a history of hypertension, hyperlipidemia, obstructive sleep apnea, and nephrolithiasis but no known cardiac history prior to this admission who was admitted on 04/28/2023 for acute STEMI after presenting with sudden onset of chest pain.  Patient presented to the [facility] ED on 04/28/2023 for sudden onset of "very severe" chest pain that occurred while he was watching football. He denied any associated shortness of breath, diaphoresis, nausea, or vomiting. No orthopnea or PND. He presented to the ED within 30 minutes of symptom onset. EKG showed normal sinus rhythm with marked ST elevation in anterolateral leads and reciprocal ST depressions in inferior leads. Code STEMI was called  and patient was administered Aspirin and IV Heparin. He was transferred emergently to [facility] for cardiac catheterization.  Hospital Course     Consultants: None  Acute Anterolateral STEMI Patient was admitted for acute anterolateral STEMI after presented with sudden onset of chest pain. EKG showed normal sinus rhythm with marked ST elevation in anterolateral leads and reciprocal ST depressions in inferior leads. High-sensitivity troponin 16 >> 56. Emergent cardiac catheterization showed 95% stenosis of ostial to proximal LAD with otherwise only minimal disease of LCX and RCA. Patient underwent successful PCI  with DES to LAD lesion. Echo showed LVEF of 70-75% with normal wall motion and grade 1 diastolic dysfunction. He has done well since PCI with no recurrent chest pain. He was started on DAPT  with Aspirin 81mg  daily and Effient 10mg  daily.  He was also started on a high-intensity statin. No beta-blocker given low resting heart rates.   Hypertension BP most well controlled this admission. He can resume home HCTZ 25mg  at discharge.  Hyperlipidemia Lipid panel this admission: Total Cholesterol 241, Triglycerides 190. HDL 41, LDL 162. Lipoprotein (a) pending. He was started on Lipitor 80mg  daily. He will need repeat lipid panel and LFTs in 6-8 weeks.  Obstructive Sleep Apnea Continue home oral appliance.  Patient was seen and examined by Dr. Antoine Poche today and felt to be stable for discharge. Outpatient follow-up arranged. Medications as below. 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 I have provided him a work note until he can be seen in the office.    Did the patient have an acute coronary syndrome (MI, NSTEMI, STEMI, etc) this admission?:  Yes                               AHA/ACC ACS Clinical Performance & Quality Measures: Aspirin prescribed? - Yes ADP Receptor Inhibitor (Plavix/Clopidogrel, Brilinta/Ticagrelor or Effient/Prasugrel) prescribed (includes medically managed patients)? - Yes Beta Blocker prescribed? - No - low resting heart rates. High Intensity Statin (Lipitor 40-80mg  or Crestor 20-40mg ) prescribed? - Yes EF assessed during THIS hospitalization? - Yes For EF <40%, was ACEI/ARB prescribed? - Not Applicable (EF >/= 40%) For EF <40%, Aldosterone Antagonist (Spironolactone or Eplerenone) prescribed? - Not Applicable (EF >/= 40%) Cardiac Rehab Phase II ordered (including medically managed patients)? - Yes   The patient will be scheduled for a TOC follow up appointment in 14 days.  A message has been sent to the [facility] and Scheduling Pool at the office where the patient should be seen for follow up.  _____________  Discharge Vitals Blood pressure (!) 113/94, pulse 68, temperature 98.1 F (36.7 C), temperature source Oral,  resp. rate 18, height 5\' 7"  (1.702 m), weight 103 kg, SpO2 97%.  Filed Weights   04/28/23 1307  Weight: 103 kg    Labs & Radiologic Studies    CBC Recent Labs    04/28/23 1419 04/28/23 1830 04/29/23 0257  WBC 9.0 10.1 8.2  NEUTROABS 6.6  --   --   HGB 16.4 15.8 15.1  HCT 48.0 45.8 45.6  MCV 83.5 81.6 83.4  PLT 263 268 235   Basic Metabolic Panel Recent Labs    13/08/65 1419 04/28/23 1830 04/29/23 0257  NA 138  --  140  K 3.3*  --  3.6  CL 106  --  110  CO2 21*  --  23  GLUCOSE 108*  --  96  BUN 18  --  14  CREATININE 0.92 1.06 0.99  CALCIUM 9.3  --  8.9   Liver Function Tests Recent Labs    04/28/23 1315 04/28/23 1419  AST 29 29  ALT 47* 43  ALKPHOS 47 41  BILITOT 0.8 0.9  PROT 7.9 6.5  ALBUMIN 4.4 3.8   No results for input(s): "LIPASE", "AMYLASE" in the last 72 hours. High Sensitivity Troponin:   Recent Labs  Lab 04/28/23 1315 04/28/23 1422  TROPONINIHS 16 56*    BNP Invalid input(s): "POCBNP" D-Dimer No  results for input(s): "DDIMER" in the last 72 hours. Hemoglobin A1C Recent Labs    04/28/23 1419  HGBA1C 5.3   Fasting Lipid Panel Recent Labs    04/28/23 1419  CHOL 241*  HDL 41  LDLCALC 162*  TRIG 190*  CHOLHDL 5.9   Thyroid Function Tests No results for input(s): "TSH", "T4TOTAL", "T3FREE", "THYROIDAB" in the last 72 hours.  Invalid input(s): "FREET3" _____________  ECHOCARDIOGRAM COMPLETE  Result Date: 04/28/2023    ECHOCARDIOGRAM REPORT   Patient Name:   Christian Foster Date of Exam: 04/28/2023 Medical Rec #:  [id number]    Height:       67.0 in Accession #:    [id number]   Weight:       227.1 lb Date of Birth:  1970/11/03    BSA:          2.134 m Patient Age:    52 years     BP:           112/86 mmHg Patient Gender: M            HR:           73 bpm. Exam Location:  Inpatient Procedure: 2D Echo, Cardiac Doppler and Color Doppler Indications:    Chest pain  History:        Patient has no prior history of Echocardiogram  examinations.  Sonographer:    Delcie Roch RDCS Referring Phys: [phone]  Sonographer Comments: Suboptimal subcostal window. IMPRESSIONS  1. Left ventricular ejection fraction, by estimation, is 70 to 75%. The left ventricle has hyperdynamic function. The left ventricle has no regional wall motion abnormalities. Left ventricular diastolic parameters are consistent with Grade I diastolic dysfunction (impaired relaxation).  2. Right ventricular systolic function is normal. The right ventricular size is normal. Tricuspid regurgitation signal is inadequate for assessing PA pressure.  3. The mitral valve is normal in structure. No evidence of mitral valve regurgitation. No evidence of mitral stenosis.  4. The aortic valve is tricuspid. Aortic valve regurgitation is not visualized. No aortic stenosis is present. FINDINGS  Left Ventricle: Left ventricular ejection fraction, by estimation, is 70 to 75%. The left ventricle has hyperdynamic function. The left ventricle has no regional wall motion abnormalities. The left ventricular internal cavity size was normal in size. There is no left ventricular hypertrophy. Left ventricular diastolic parameters are consistent with Grade I diastolic dysfunction (impaired relaxation). Right Ventricle: The right ventricular size is normal. Right vetricular wall thickness was not well visualized. Right ventricular systolic function is normal. Tricuspid regurgitation signal is inadequate for assessing PA pressure. Left Atrium: Left atrial size was normal in size. Right Atrium: Right atrial size was normal in size. Pericardium: There is no evidence of pericardial effusion. Mitral Valve: The mitral valve is normal in structure. No evidence of mitral valve regurgitation. No evidence of mitral valve stenosis. Tricuspid Valve: The tricuspid valve is normal in structure. Tricuspid valve regurgitation is not demonstrated. No evidence of tricuspid stenosis. Aortic Valve: The aortic  valve is tricuspid. Aortic valve regurgitation is not visualized. No aortic stenosis is present. Aortic valve mean gradient measures 3.3 mmHg. Aortic valve peak gradient measures 4.8 mmHg. Aortic valve area, by VTI measures 2.92 cm. Pulmonic Valve: The pulmonic valve was not well visualized. Pulmonic valve regurgitation is not visualized. No evidence of pulmonic stenosis. Aorta: The aortic root and ascending aorta are structurally normal, with no evidence of dilitation. Venous: The inferior vena cava was not well visualized. IAS/Shunts: The  interatrial septum was not well visualized.  LEFT VENTRICLE PLAX 2D LVIDd:         4.50 cm   Diastology LVIDs:         2.30 cm   LV e' medial:    7.18 cm/s LV PW:         1.00 cm   LV E/e' medial:  6.7 LV IVS:        1.10 cm   LV e' lateral:   12.30 cm/s LVOT diam:     2.20 cm   LV E/e' lateral: 3.9 LV SV:         70 LV SV Index:   33 LVOT Area:     3.80 cm  RIGHT VENTRICLE RV Basal diam:  2.30 cm RV S prime:     30.30 cm/s TAPSE (M-mode): 2.2 cm LEFT ATRIUM             Index        RIGHT ATRIUM           Index LA diam:        3.60 cm 1.69 cm/m   RA Area:     15.00 cm LA Vol (A2C):   41.8 ml 19.58 ml/m  RA Volume:   34.40 ml  16.12 ml/m LA Vol (A4C):   44.2 ml 20.71 ml/m LA Biplane Vol: 45.0 ml 21.08 ml/m  AORTIC VALVE AV Area (Vmax):    3.34 cm AV Area (Vmean):   2.80 cm AV Area (VTI):     2.92 cm AV Vmax:           109.95 cm/s AV Vmean:          87.237 cm/s AV VTI:            0.240 m AV Peak Grad:      4.8 mmHg AV Mean Grad:      3.3 mmHg LVOT Vmax:         96.70 cm/s LVOT Vmean:        64.300 cm/s LVOT VTI:          0.184 m LVOT/AV VTI ratio: 0.77  AORTA Ao Root diam: 3.20 cm Ao Asc diam:  3.20 cm MITRAL VALVE MV Area (PHT): 3.21 cm    SHUNTS MV Decel Time: 236 msec    Systemic VTI:  0.18 m MV E velocity: 48.00 cm/s  Systemic Diam: 2.20 cm MV A velocity: 61.60 cm/s MV E/A ratio:  0.78 Dina Rich MD Electronically signed by Dina Rich MD Signature  Date/Time: 04/28/2023/6:46:35 PM    Final    CARDIAC CATHETERIZATION  Result Date: 04/28/2023   Ost LAD to Prox LAD lesion is 95% stenosed.   A drug-eluting stent was successfully placed using a SYNERGY XD 4.0X16.   Post intervention, there is a 0% residual stenosis.   The left ventricular systolic function is normal.   LV end diastolic pressure is normal.   The left ventricular ejection fraction is 55-65% by visual estimate.   Recommend uninterrupted dual antiplatelet therapy with Aspirin 81mg  daily and Prasugrel 10mg  daily for a minimum of 12 months (ACS-Class I recommendation). 1.  Acute anterior STEMI, treated successfully with primary PCI using a 4.0 x 16 mm Synergy DES 2.  Patent left main, left circumflex, and dominant RCA with mild nonobstructive plaquing noted 3.  Normal LV function with no regional wall motion abnormalities and LVEF estimated at 65% Post-MI medical therapy. ASA/prasugrel x 12 months without interruption. High-intensity statin Rx.  If no complications arise, OK for DC tomorrow.   DG Chest Port 1 View  Result Date: 04/28/2023 CLINICAL DATA:  52 year old male with history of chest pain. EXAM: PORTABLE CHEST 1 VIEW COMPARISON:  No priors. FINDINGS: Transcutaneous defibrillator pads project over the upper and lower thorax. Lung volumes are low. No consolidative airspace disease. No pleural effusions. No pneumothorax. No pulmonary nodule or mass noted. Pulmonary vasculature and the cardiomediastinal silhouette are within normal limits. IMPRESSION: 1. Low lung volumes without radiographic evidence of acute cardiopulmonary disease. Electronically Signed   By: Trudie Reed M.D.   On: 04/28/2023 13:39   Disposition   Patient is being discharged home today in good condition.  Follow-up Plans & Appointments     Follow-up Information     Corrin Parker, PA-C Follow up.   Specialty: Cardiology Why: Hospital follow-up with Cardiology scheduled for 05/11/2023 at 2:45pm. Please  arrive 15 minutes early for check-in. If this date/time does not work for you, please call our office to reschedule. Contact information: [address] [place] [address] [phone]                Discharge Instructions     AMB Referral to Cardiac Rehabilitation - Phase II   Complete by: As directed    Diagnosis: STEMI   After initial evaluation and assessments completed: Virtual Based Care may be provided alone or in conjunction with Phase 2 Cardiac Rehab based on patient barriers.: Yes   Intensive Cardiac Rehabilitation (ICR) [facility] location only OR Traditional Cardiac Rehabilitation (TCR) *If criteria for ICR are not met will enroll in TCR [facility] only): Yes   Diet - low sodium heart healthy   Complete by: As directed    Increase activity slowly   Complete by: As directed         Discharge Medications   Allergies as of 04/29/2023   No Known Allergies      Medication List     STOP taking these medications    meloxicam 15 MG tablet Commonly known as: MOBIC       TAKE these medications    aspirin EC 81 MG tablet Take 1 tablet (81 mg total) by mouth daily. Swallow whole.   atorvastatin 80 MG tablet Commonly known as: LIPITOR Take 1 tablet (80 mg total) by mouth daily.   escitalopram 20 MG tablet Commonly known as: LEXAPRO Take 20 mg by mouth daily.   hydrochlorothiazide 25 MG tablet Commonly known as: HYDRODIURIL Take 25 mg by mouth daily.   nitroGLYCERIN 0.4 MG SL tablet Commonly known as: NITROSTAT Place 1 tablet (0.4 mg total) under the tongue every 5 (five) minutes x 3 doses as needed for chest pain.   prasugrel 10 MG Tabs tablet Commonly known as: EFFIENT Take 1 tablet (10 mg total) by mouth daily. To be filled on 05/30/2023.           Outstanding Labs/Studies   Will need repeat lipid panel and LFTs in 6-8 weeks.  Duration of Discharge Encounter   Greater than 30 minutes including physician time.  Signed, Corrin Parker, PA-C 04/29/2023, 11:41 AM

## 2023-04-30 ENCOUNTER — Telehealth (HOSPITAL_COMMUNITY): Payer: Self-pay | Admitting: *Deleted

## 2023-04-30 ENCOUNTER — Encounter (HOSPITAL_COMMUNITY): Payer: Self-pay | Admitting: Cardiovascular Disease

## 2023-04-30 LAB — LIPOPROTEIN A (LPA): Lipoprotein (a): 8.4 nmol/L (ref <75.0)

## 2023-04-30 NOTE — Telephone Encounter (Signed)
Patient contacted regarding discharge from [facility] hospital on 04/29/23.  Patient understands to follow up with Marjie Skiff PA on 05/11/23 at 2:45 pm at [facility] office. Patient understands discharge instructions. Patient understands medications and regiment. Patient understands to bring all medications to this visit.

## 2023-04-30 NOTE — Telephone Encounter (Signed)
CARDIAC REHAB PHASE I     Post MI/stent education including restrictions, risk factors, exercise guidelines, antiplatelet therapy importance, MI booklet, NTG use, heart healthy diet and CRP2 reviewed via phone. All questions and concerns addressed. Will refer to [facility] for CRP2. Post MI/stent educational materials will be mailed later today.  Woodroe Chen, RN BSN 04/30/2023 11:43 AM

## 2023-05-01 NOTE — Progress Notes (Signed)
Cardiology Office Note:    Date:  05/11/2023   ID:  [name], DOB Apr 25, 1971, MRN [id number]  PCP:  Loyal Jacobson, MD  Cardiologist:  Tonny Bollman, MD     Referring MD: Loyal Jacobson, MD   Chief Complaint: hospital follow-up of STEMI  History of Present Illness:    Christian Foster is a 52 y.o. male with a history of CAD with recent STEMI on 04/28/2023 s/p DES to LAD, hypertension, hyperlipidemia, obstructive sleep apnea, and nephrolithiasis who is followed by Dr. Excell Seltzer and presents today for hospital follow-up of STEMI.  Patient was first seen by Cardiology during recent hospitalization. He was admitted from 04/28/2023 to 04/29/2023 for acute STEMI after presenting with sudden onset of chest pain. EKG showed normal sinus rhythm with marked ST elevation in anterolateral leads and reciprocal ST depressions in inferior leads. High-sensitivity troponin 16 >> 56. Emergent cardiac catheterization showed 95% stenosis of ostial to proximal LAD with otherwise only minimal disease of LCX and RCA. Patient underwent successful PCI with DES to LAD lesion. Echo showed LVEF of 70-75% with normal wall motion and grade 1 diastolic dysfunction. He was started on DAPT with Aspirin and Effient as well as a high-intensity statin. A beta-blocker was not added due to low resting heart rates.   Patient presents today for follow-up. Overall, he is doing well since discharge. He reports occasional soreness in the middle of his chest that occurs at rest but this does not feel like the pain that he had prior to his heart attack. No exertion chest pain/ discomfort. No shortness of breath, orthopnea, PND, or edema. He reports a little lightheadedness/ dizziness if he stands too quickly but this does not last long. No palpitations, falls, or syncope.  EKGs/Labs/Other Studies Reviewed:    The following studies were reviewed:  Left Cardiac Catheterization 04/28/2023:   Ost LAD to Prox LAD lesion is 95% stenosed.    A drug-eluting stent was successfully placed using a SYNERGY XD 4.0X16.   Post intervention, there is a 0% residual stenosis.   The left ventricular systolic function is normal.   LV end diastolic pressure is normal.   The left ventricular ejection fraction is 55-65% by visual estimate.   Recommend uninterrupted dual antiplatelet therapy with Aspirin 81mg  daily and Prasugrel 10mg  daily for a minimum of 12 months (ACS-Class I recommendation).   1.  Acute anterior STEMI, treated successfully with primary PCI using a 4.0 x 16 mm Synergy DES 2.  Patent left main, left circumflex, and dominant RCA with mild nonobstructive plaquing noted 3.  Normal LV function with no regional wall motion abnormalities and LVEF estimated at 65%   Post-MI medical therapy. ASA/prasugrel x 12 months without interruption. High-intensity statin Rx. If no complications arise, OK for DC tomorrow.    Diagnostic Dominance: Right  Intervention      _____________   Echocardiogram 04/28/2023: Impressions: 1. Left ventricular ejection fraction, by estimation, is 70 to 75%. The  left ventricle has hyperdynamic function. The left ventricle has no  regional wall motion abnormalities. Left ventricular diastolic parameters  are consistent with Grade I diastolic  dysfunction (impaired relaxation).   2. Right ventricular systolic function is normal. The right ventricular  size is normal. Tricuspid regurgitation signal is inadequate for assessing  PA pressure.   3. The mitral valve is normal in structure. No evidence of mitral valve  regurgitation. No evidence of mitral stenosis.   4. The aortic valve is tricuspid. Aortic valve regurgitation is not  visualized. No aortic stenosis is present.   EKG:  EKG ordered today.   EKG Interpretation Date/Time:  Friday May 11 2023 15:02:07 EST Ventricular Rate:  69 PR Interval:  142 QRS Duration:  84 QT Interval:  388 QTC Calculation: 415 R Axis:   9  Text  Interpretation: Normal sinus rhythm Normal ECG Non-specific T wave changes Confirmed by Marjie Skiff [phone]) on 05/11/2023 3:09:38 PM   Recent Labs: 04/28/2023: ALT 43 04/29/2023: BUN 14; Creatinine, Ser 0.99; Hemoglobin 15.1; Platelets 235; Potassium 3.6; Sodium 140  Recent Lipid Panel    Component Value Date/Time   CHOL 241 (H) 04/28/2023 1419   TRIG 190 (H) 04/28/2023 1419   HDL 41 04/28/2023 1419   CHOLHDL 5.9 04/28/2023 1419   VLDL 38 04/28/2023 1419   LDLCALC 162 (H) 04/28/2023 1419    Physical Exam:    Vital Signs: BP 124/86   Pulse 69   Ht 5\' 7"  (1.702 m)   Wt 234 lb (106.1 kg)   SpO2 94%   BMI 36.65 kg/m     Wt Readings from Last 3 Encounters:  05/11/23 234 lb (106.1 kg)  04/28/23 227 lb 1.2 oz (103 kg)  02/04/18 227 lb (103 kg)     General: 52 y.o. Caucasian male in no acute distress. HEENT: Normocephalic and atraumatic. Sclera clear.  Neck: Supple. No carotid bruits. No JVD. Heart: RRR. Distinct S1 and S2. No murmurs, gallops, or rubs.  Lungs: No increased work of breathing. Clear to ausculation bilaterally. No wheezes, rhonchi, or rales.  Extremities: No lower extremity edema. Right radial cath site soft with no signs of edema.  Skin: Warm and dry. Neuro: No focal deficits. Psych: Normal affect. Responds appropriately.   Assessment:    1. CAD   2. Recent STEMI   3. Primary hypertension   4. Hyperlipidemia, unspecified hyperlipidemia type   5. Hypokalemia   6. Obstructive sleep apnea     Plan:    CAD with Recent STEMI Patient was recently admitted with a STEMI on 04/28/2023. LHC showed 95% stenosis of ostial to proximal LAD with otherwise only minimal disease of LCX and RCA. He underwent successful PCI with DES to LAD lesion.  - He reports some occasional chest soreness at rest but no pain like what he was having prior to MI. No exertional symptoms.  - Will start Amlodipine 5mg  daily.  - No beta-blocker given baseline low heart rates. -  Continue DAPT with Aspirin 81mg  daily and Effient 10mg  daily.  - Continue high-intensity statin.  - Recommend Cardiac Rehab.  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   Hypertension BP well controlled. He describes some mild lightheadedness/ dizziness when standing quickly which sounds like orthostasis.  - Will stop HCTZ and start Amlodipine for its antianginal benefit as above.   Hyperlipidemia Lipid panel on 04/28/2023: Total Cholesterol 241, Triglycerides 190, HDL 41, LDL 162. Lipoprotein (a) 8.4. LDL goal <70 given CAD.  - He was started on Lipitor 80mg  daily during recent admission. Continue.  - Will repeat lipid panel and LFTs in 6-8 weeks.   Hypokalemia Patient had mild hypokalemia during admission. However, potassium was stable at 3.6 on discharge. - Will repeat BMET today.   Obstructive Sleep Apnea He reports being diagnosed with mild sleep apnea in the past. Previously was on CPAP but did not like this.  He now uses an oral appliance which he states works well.   Disposition: Follow up in 3 months.    Signed, Corrin Parker, PA-C  05/11/2023 5:05 PM     [facility]

## 2023-05-03 ENCOUNTER — Telehealth (HOSPITAL_COMMUNITY): Payer: Self-pay

## 2023-05-03 NOTE — Telephone Encounter (Signed)
Pt insurance is active and benefits verified through [facility]. Co-pay $25.00, DED $500.00/$500.00 met, out of pocket $3,000.00/$573.51 met, co-insurance 0%. No pre-authorization required. Passport, 05/03/23 @ 12:23PM, [phone]   How many CR sessions are covered? 36 only ICR Is this a lifetime maximum or an annual maximum? Annual Has the member used any of these services to date? No Is there a time limit (weeks/months) on start of program and/or program completion? No     Will contact patient to see if he is interested in the Cardiac Rehab Program. If interested, patient will need to complete follow up appt. Once completed, patient will be contacted for scheduling upon review by the RN Navigator.

## 2023-05-03 NOTE — Telephone Encounter (Signed)
Called patient to see if he is interested in the Cardiac Rehab Program. Patient expressed interest. Explained scheduling process and went over insurance, patient verbalized understanding. Will contact patient for scheduling once f/u has been completed.  °

## 2023-05-11 ENCOUNTER — Ambulatory Visit: Payer: 59 | Attending: Student | Admitting: Student

## 2023-05-11 ENCOUNTER — Encounter: Payer: Self-pay | Admitting: Student

## 2023-05-11 VITALS — BP 124/86 | HR 69 | Ht 67.0 in | Wt 234.0 lb

## 2023-05-11 DIAGNOSIS — I252 Old myocardial infarction: Secondary | ICD-10-CM

## 2023-05-11 DIAGNOSIS — E785 Hyperlipidemia, unspecified: Secondary | ICD-10-CM | POA: Diagnosis not present

## 2023-05-11 DIAGNOSIS — E876 Hypokalemia: Secondary | ICD-10-CM

## 2023-05-11 DIAGNOSIS — I2510 Atherosclerotic heart disease of native coronary artery without angina pectoris: Secondary | ICD-10-CM | POA: Diagnosis not present

## 2023-05-11 DIAGNOSIS — I10 Essential (primary) hypertension: Secondary | ICD-10-CM | POA: Diagnosis not present

## 2023-05-11 DIAGNOSIS — G4733 Obstructive sleep apnea (adult) (pediatric): Secondary | ICD-10-CM

## 2023-05-11 MED ORDER — AMLODIPINE BESYLATE 5 MG PO TABS: 5.0000 mg | ORAL_TABLET | Freq: Every day | ORAL | 3 refills | Status: DC

## 2023-05-11 NOTE — Patient Instructions (Signed)
Medication Instructions:  STOP HCTZ START AMLODIPINE 5MG  DAILY *If you need a refill on your cardiac medications before your next appointment, please call your pharmacy*  Lab Work: BMET TODAY AND FASTING LIPID AND LFT IN If you have labs (blood work) drawn today and your tests are completely normal, you will receive your results only by:  MyChart Message (if you have MyChart) OR  A paper copy in the mail If you have any lab test that is abnormal or we need to change your treatment, we will call you to review the results.  Follow-Up: At [facility], you and your health needs are our priority.  As part of our continuing mission to provide you with exceptional heart care, we have created designated Provider Care Teams.  These Care Teams include your primary Cardiologist (physician) and Advanced Practice Providers (APPs -  Physician Assistants and Nurse Practitioners) who all work together to provide you with the care you need, when you need it.  Your next appointment:   3 month(s)  Provider:   Marjie Skiff, PA-C

## 2023-05-12 LAB — BASIC METABOLIC PANEL
BUN/Creatinine Ratio: 19 (ref 9–20)
BUN: 21 mg/dL (ref 6–24)
CO2: 21 mmol/L (ref 20–29)
Calcium: 9.3 mg/dL (ref 8.7–10.2)
Chloride: 101 mmol/L (ref 96–106)
Creatinine, Ser: 1.09 mg/dL (ref 0.76–1.27)
Glucose: 84 mg/dL (ref 70–99)
Potassium: 4 mmol/L (ref 3.5–5.2)
Sodium: 139 mmol/L (ref 134–144)
eGFR: 83 mL/min/{1.73_m2} (ref >59)

## 2023-05-14 ENCOUNTER — Telehealth: Payer: Self-pay | Admitting: *Deleted

## 2023-05-14 NOTE — Telephone Encounter (Signed)
Sorry, I cannot see anything under this telephone encounter. Is there anything I need to do?  Thanks! Sharyl Panchal

## 2023-05-16 ENCOUNTER — Other Ambulatory Visit: Payer: Self-pay

## 2023-05-16 ENCOUNTER — Telehealth (HOSPITAL_COMMUNITY): Payer: Self-pay

## 2023-05-16 ENCOUNTER — Encounter (HOSPITAL_COMMUNITY): Payer: Self-pay

## 2023-05-16 MED ORDER — ASPIRIN 81 MG PO TBEC: 81.0000 mg | DELAYED_RELEASE_TABLET | Freq: Every day | ORAL | 3 refills | Status: DC

## 2023-05-16 MED ORDER — ATORVASTATIN CALCIUM 80 MG PO TABS: 80.0000 mg | ORAL_TABLET | Freq: Every day | ORAL | 3 refills | Status: DC

## 2023-05-16 NOTE — Telephone Encounter (Signed)
Pt returned CR phone call and stated he is interested in CR. Patient will come in for orientation on 05/24/23 @ 8AM and will attend the 6:45AM exercise class.   Pensions consultant.

## 2023-05-17 ENCOUNTER — Telehealth (HOSPITAL_COMMUNITY): Payer: Self-pay

## 2023-05-17 NOTE — Telephone Encounter (Signed)
Called pt to confirm appt for 12/26, no answer and voicemail full.   Jonna Coup, MS, ACSM-CEP 05/17/2023 4:38 PM

## 2023-05-24 ENCOUNTER — Encounter (HOSPITAL_COMMUNITY)
Admission: RE | Admit: 2023-05-24 | Discharge: 2023-05-24 | Disposition: A | Discharge status: Home / Self Care | Payer: 59 | Source: Ambulatory Visit | Attending: Cardiovascular Disease | Admitting: Cardiovascular Disease

## 2023-05-24 VITALS — BP 118/78 | HR 76 | Ht 67.0 in | Wt 234.2 lb

## 2023-05-24 DIAGNOSIS — Z955 Presence of coronary angioplasty implant and graft: Secondary | ICD-10-CM | POA: Diagnosis not present

## 2023-05-24 DIAGNOSIS — I213 ST elevation (STEMI) myocardial infarction of unspecified site: Secondary | ICD-10-CM | POA: Insufficient documentation

## 2023-05-24 DIAGNOSIS — I2102 ST elevation (STEMI) myocardial infarction involving left anterior descending coronary artery: Secondary | ICD-10-CM

## 2023-05-24 NOTE — Progress Notes (Signed)
CARDIAC REHAB PHASE 2  Cardiac Rehab Medication Review   Does the patient  feel that his/her medications are working for him/her?  yes  Has the patient been experiencing any side effects to the medications prescribed?  no  Does the patient measure his/her own blood pressure or blood glucose at home?  yes   Does the patient have any problems obtaining medications due to transportation or finances?   no  Understanding of regimen: excellent Understanding of indications: excellent Potential of compliance: excellent    Comments: Pt feels good about his medication routine. He has a BP cuff at home and checks it about once a week. Vitals have been good at home.      Harrie Jeans ACSM-CEP 05/24/2023 8:24 AM

## 2023-05-24 NOTE — Progress Notes (Signed)
Cardiac Individual Treatment Plan  Patient Details  Name: Christian Foster MRN: [id number] Date of Birth: 01-Sep-1970 Referring Provider:   Flowsheet Row INTENSIVE CARDIAC REHAB ORIENT from 05/24/2023 in [facility]  Referring Provider Dr. Tonny Bollman MD       Initial Encounter Date:  Flowsheet Row INTENSIVE CARDIAC REHAB ORIENT from 05/24/2023 in [facility], [facility]  Date 05/24/23       Visit Diagnosis: 04/28/23 ST elevation myocardial infarction involving left anterior descending (LAD) coronary artery (HCC)  04/28/23 Status post coronary artery stent placement LAD  Patient's Home Medications on Admission:  Current Outpatient Medications:    amLODipine (NORVASC) 5 MG tablet, Take 1 tablet (5 mg total) by mouth daily., Disp: 30 tablet, Rfl: 3   aspirin EC 81 MG tablet, Take 1 tablet (81 mg total) by mouth daily. Swallow whole., Disp: 90 tablet, Rfl: 3   atorvastatin (LIPITOR) 80 MG tablet, Take 1 tablet (80 mg total) by mouth daily., Disp: 90 tablet, Rfl: 3   escitalopram (LEXAPRO) 20 MG tablet, Take 20 mg by mouth daily., Disp: , Rfl:    nitroGLYCERIN (NITROSTAT) 0.4 MG SL tablet, Place 1 tablet (0.4 mg total) under the tongue every 5 (five) minutes x 3 doses as needed for chest pain., Disp: 25 tablet, Rfl: 0   prasugrel (EFFIENT) 10 MG TABS tablet, Take 1 tablet (10 mg total) by mouth daily. To be filled on 05/30/2023., Disp: 30 tablet, Rfl: 0  Past Medical History: Past Medical History:  Diagnosis Date   History of kidney stones    Sleep apnea    Pt states he was told after last surgery that he needed to be tested for sleep apnea. stated he "stopped breathing and had to be bagged" Pt has not had follow up testing    Tobacco Use: Social History   Tobacco Use  Smoking Status Never  Smokeless Tobacco Never    Labs: Review Flowsheet       Latest Ref Rng & Units  04/28/2023  Labs for ITP Cardiac and Pulmonary Rehab  Cholestrol 0 - 200 mg/dL 106  269   LDL (calc) 0 - 99 mg/dL 485  462   HDL-C >70 mg/dL 41  45   Trlycerides <350 mg/dL 093  818   Hemoglobin E9H 4.8 - 5.6 % 5.3  5.2   TCO2 22 - 32 mmol/L 25     Details       Multiple values from one day are sorted in reverse-chronological order         Capillary Blood Glucose: No results found for: "GLUCAP"   Exercise Target Goals: Exercise Program Goal: Individual exercise prescription set using results from initial 6 min walk test and THRR while considering  patient's activity barriers and safety.   Exercise Prescription Goal: Initial exercise prescription builds to 30-45 minutes a day of aerobic activity, 2-3 days per week.  Home exercise guidelines will be given to patient during program as part of exercise prescription that the participant will acknowledge.  Activity Barriers & Risk Stratification:  Activity Barriers & Cardiac Risk Stratification - 05/24/23 0939       Activity Barriers & Cardiac Risk Stratification   Activity Barriers None    Cardiac Risk Stratification High   under 5 MET's on pre            6 Minute Walk:  6 Minute Walk  Row Name 05/24/23 1014         6 Minute Walk   Phase Initial     Distance 1370 feet     Walk Time 6 minutes     # of Rest Breaks 0     MPH 2.59     METS 3.52     RPE 11     Perceived Dyspnea  0     VO2 Peak 12.3     Symptoms No     Resting HR 76 bpm     Resting BP 118/78     Resting Oxygen Saturation  98 %     Exercise Oxygen Saturation  during 6 min walk 98 %     Max Ex. HR 91 bpm     Max Ex. BP 134/80     2 Minute Post BP 120/78              Oxygen Initial Assessment:   Oxygen Re-Evaluation:   Oxygen Discharge (Final Oxygen Re-Evaluation):   Initial Exercise Prescription:  Initial Exercise Prescription - 05/24/23 1000       Date of Initial Exercise RX and Referring Provider   Date 05/24/23     Referring Provider Dr. Tonny Bollman MD    Expected Discharge Date 08/15/23      NuStep   Level 2    SPM 80    Minutes 15    METs 2      Recumbant Elliptical   Level 1    RPM 40    Watts 60    Minutes 15    METs 2      Prescription Details   Frequency (times per week) 3    Duration Progress to 30 minutes of continuous aerobic without signs/symptoms of physical distress      Intensity   THRR 40-80% of Max Heartrate 68-136    Ratings of Perceived Exertion 11-13    Perceived Dyspnea 0-4      Progression   Progression Continue progressive overload as per policy without signs/symptoms or physical distress.      Resistance Training   Training Prescription Yes    Weight 3    Reps 10-15             Perform Capillary Blood Glucose checks as needed.  Exercise Prescription Changes:   Exercise Comments:   Exercise Goals and Review:   Exercise Goals     Row Name 05/24/23 0758             Exercise Goals   Increase Physical Activity Yes       Intervention Provide advice, education, support and counseling about physical activity/exercise needs.;Develop an individualized exercise prescription for aerobic and resistive training based on initial evaluation findings, risk stratification, comorbidities and participant's personal goals.       Expected Outcomes Short Term: Attend rehab on a regular basis to increase amount of physical activity.;Long Term: Add in home exercise to make exercise part of routine and to increase amount of physical activity.;Long Term: Exercising regularly at least 3-5 days a week.       Increase Strength and Stamina Yes       Intervention Provide advice, education, support and counseling about physical activity/exercise needs.;Develop an individualized exercise prescription for aerobic and resistive training based on initial evaluation findings, risk stratification, comorbidities and participant's personal goals.       Expected Outcomes Short  Term: Increase workloads from initial exercise prescription for resistance, speed, and METs.;Short  Term: Perform resistance training exercises routinely during rehab and add in resistance training at home;Long Term: Improve cardiorespiratory fitness, muscular endurance and strength as measured by increased METs and functional capacity ( )       Able to understand and use rate of perceived exertion (RPE) scale Yes       Intervention Provide education and explanation on how to use RPE scale       Expected Outcomes Short Term: Able to use RPE daily in rehab to express subjective intensity level;Long Term:  Able to use RPE to guide intensity level when exercising independently       Knowledge and understanding of Target Heart Rate Range (THRR) Yes       Intervention Provide education and explanation of THRR including how the numbers were predicted and where they are located for reference       Expected Outcomes Short Term: Able to state/look up THRR;Long Term: Able to use THRR to govern intensity when exercising independently;Short Term: Able to use daily as guideline for intensity in rehab       Understanding of Exercise Prescription Yes       Intervention Provide education, explanation, and written materials on patient's individual exercise prescription       Expected Outcomes Short Term: Able to explain program exercise prescription;Long Term: Able to explain home exercise prescription to exercise independently                Exercise Goals Re-Evaluation :   Discharge Exercise Prescription (Final Exercise Prescription Changes):   Nutrition:  Target Goals: Understanding of nutrition guidelines, daily intake of sodium 1500mg , cholesterol 200mg , calories 30% from fat and 7% or less from saturated fats, daily to have 5 or more servings of fruits and vegetables.  Biometrics:  Pre Biometrics - 05/24/23 0917       Pre Biometrics   Waist Circumference 43 inches    Hip Circumference 45  inches    Waist to Hip Ratio 0.96 %    Triceps Skinfold 12 mm    % Body Fat 31 %    Grip Strength 55 kg    Flexibility 4 in    Single Leg Stand 30 seconds              Nutrition Therapy Plan and Nutrition Goals:   Nutrition Assessments:  Nutrition Assessments - 05/24/23 0956       Rate Your Plate Scores   Pre Score 57            MEDIFICTS Score Key: >=70 Need to make dietary changes  40-70 Heart Healthy Diet <= 40 Therapeutic Level Cholesterol Diet   Flowsheet Row INTENSIVE CARDIAC REHAB ORIENT from 05/24/2023 in [facility]  Picture Your Plate Total Score on Admission 57      Picture Your Plate Scores: <16 Unhealthy dietary pattern with much room for improvement. 41-50 Dietary pattern unlikely to meet recommendations for good health and room for improvement. 51-60 More healthful dietary pattern, with some room for improvement.  >60 Healthy dietary pattern, although there may be some specific behaviors that could be improved.    Nutrition Goals Re-Evaluation:   Nutrition Goals Re-Evaluation:   Nutrition Goals Discharge (Final Nutrition Goals Re-Evaluation):   Psychosocial: Target Goals: Acknowledge presence or absence of significant depression and/or stress, maximize coping skills, provide positive support system. Participant is able to verbalize types and ability to use techniques and skills needed for reducing stress  and depression.  Initial Review & Psychosocial Screening:  Initial Psych Review & Screening - 05/24/23 0940       Initial Review   Current issues with Current Anxiety/Panic;Current Stress Concerns;Current Sleep Concerns    Source of Stress Concerns Family    Comments Pt feels so anxiety and stress due to work and family, But has a good support system with his family and work friends. Pt has no additional needs at this time.      Family Dynamics   Good Support System? Yes       Screening Interventions   Interventions Encouraged to exercise;Provide feedback about the scores to participant    Expected Outcomes Long Term Goal: Stressors or current issues are controlled or eliminated.;Short Term goal: Identification and review with participant of any Quality of Life or Depression concerns found by scoring the questionnaire.;Long Term goal: The participant improves quality of Life and PHQ9 Scores as seen by post scores and/or verbalization of changes             Quality of Life Scores:  Quality of Life - 05/24/23 0947       Quality of Life   Select Quality of Life      Quality of Life Scores   Health/Function Pre 22.6 %    Socioeconomic Pre 23.97 %    Psych/Spiritual Pre 25.2 %    Family Pre 25.2 %    GLOBAL Pre 22.71 %            Scores of 19 and below usually indicate a poorer quality of life in these areas.  A difference of  2-3 points is a clinically meaningful difference.  A difference of 2-3 points in the total score of the Quality of Life Index has been associated with significant improvement in overall quality of life, self-image, physical symptoms, and general health in studies assessing change in quality of life.  PHQ-9: Review Flowsheet       05/24/2023  Depression screen PHQ 2/9  Decreased Interest 2  Down, Depressed, Hopeless 0  PHQ - 2 Score 2  Altered sleeping 1  Tired, decreased energy 2  Change in appetite 0  Feeling bad or failure about yourself  0  Trouble concentrating 2  Moving slowly or fidgety/restless 0  Suicidal thoughts 0  PHQ-9 Score 7  Difficult doing work/chores Not difficult at all   Interpretation of Total Score  Total Score Depression Severity:  1-4 = Minimal depression, 5-9 = Mild depression, 10-14 = Moderate depression, 15-19 = Moderately severe depression, 20-27 = Severe depression   Psychosocial Evaluation and Intervention:   Psychosocial Re-Evaluation:   Psychosocial Discharge (Final Psychosocial  Re-Evaluation):   Vocational Rehabilitation: Provide vocational rehab assistance to qualifying candidates.   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            Education: Education Goals: Education classes will be provided on a weekly basis, covering required topics. Participant will state understanding/return demonstration of topics presented.     Core Videos: Exercise    Move It!  Clinical staff conducted group or individual video education with verbal and written material and guidebook.  Patient learns the recommended Pritikin exercise program. Exercise with the goal of living a long, healthy life. Some of the health benefits  of exercise include controlled diabetes, healthier blood pressure levels, improved cholesterol levels, improved heart and lung capacity, improved sleep, and better body composition. Everyone should speak with their doctor before starting or changing an exercise routine.  Biomechanical Limitations Clinical staff conducted group or individual video education with verbal and written material and guidebook.  Patient learns how biomechanical limitations can impact exercise and how we can mitigate and possibly overcome limitations to have an impactful and balanced exercise routine.  Body Composition Clinical staff conducted group or individual video education with verbal and written material and guidebook.  Patient learns that body composition (ratio of muscle mass to fat mass) is a key component to assessing overall fitness, rather than body weight alone. Increased fat mass, especially visceral belly fat, can put [place] at increased risk for metabolic syndrome, type 2 diabetes, heart disease, and even death. It is recommended to combine diet and exercise (cardiovascular  and resistance training) to improve your body composition. Seek guidance from your physician and exercise physiologist before implementing an exercise routine.  Exercise Action Plan Clinical staff conducted group or individual video education with verbal and written material and guidebook.  Patient learns the recommended strategies to achieve and enjoy long-term exercise adherence, including variety, self-motivation, self-efficacy, and positive decision making. Benefits of exercise include fitness, good health, weight management, more energy, better sleep, less stress, and overall well-being.  Medical   Heart Disease Risk Reduction Clinical staff conducted group or individual video education with verbal and written material and guidebook.  Patient learns our heart is our most vital organ as it circulates oxygen, nutrients, white blood cells, and hormones throughout the entire body, and carries waste away. Data supports a plant-based eating plan like the Pritikin Program for its effectiveness in slowing progression of and reversing heart disease. The video provides a number of recommendations to address heart disease.   Metabolic Syndrome and Belly Fat  Clinical staff conducted group or individual video education with verbal and written material and guidebook.  Patient learns what metabolic syndrome is, how it leads to heart disease, and how one can reverse it and keep it from coming back. You have metabolic syndrome if you have 3 of the following 5 criteria: abdominal obesity, high blood pressure, high triglycerides, low HDL cholesterol, and high blood sugar.  Hypertension and Heart Disease Clinical staff conducted group or individual video education with verbal and written material and guidebook.  Patient learns that high blood pressure, or hypertension, is very common in the [place]. Hypertension is largely due to excessive salt intake, but other important risk factors include being  overweight, physical inactivity, drinking too much alcohol, smoking, and not eating enough potassium from fruits and vegetables. High blood pressure is a leading risk factor for heart attack, stroke, congestive heart failure, dementia, kidney failure, and premature death. Long-term effects of excessive salt intake include stiffening of the arteries and thickening of heart muscle and organ damage. Recommendations include ways to reduce hypertension and the risk of heart disease.  Diseases of Our Time - Focusing on Diabetes Clinical staff conducted group or individual video education with verbal and written material and guidebook.  Patient learns why the best way to stop diseases of our time is prevention, through food and other lifestyle changes. Medicine (such as prescription pills and surgeries) is often only a Band-Aid on the problem, not a long-term solution. Most common diseases of our time include obesity, type 2 diabetes, hypertension, heart disease, and cancer. The Pritikin Program is  recommended and has been proven to help reduce, reverse, and/or prevent the damaging effects of metabolic syndrome.  Nutrition   Overview of the Pritikin Eating Plan  Clinical staff conducted group or individual video education with verbal and written material and guidebook.  Patient learns about the Pritikin Eating Plan for disease risk reduction. The Pritikin Eating Plan emphasizes a wide variety of unrefined, minimally-processed carbohydrates, like fruits, vegetables, whole grains, and legumes. Go, Caution, and Stop food choices are explained. Plant-based and lean animal proteins are emphasized. Rationale provided for low sodium intake for blood pressure control, low added sugars for blood sugar stabilization, and low added fats and oils for coronary artery disease risk reduction and weight management.  Calorie Density  Clinical staff conducted group or individual video education with verbal and written material  and guidebook.  Patient learns about calorie density and how it impacts the Pritikin Eating Plan. Knowing the characteristics of the food you choose will help you decide whether those foods will lead to weight gain or weight loss, and whether you want to consume more or less of them. Weight loss is usually a side effect of the Pritikin Eating Plan because of its focus on low calorie-dense foods.  Label Reading  Clinical staff conducted group or individual video education with verbal and written material and guidebook.  Patient learns about the Pritikin recommended label reading guidelines and corresponding recommendations regarding calorie density, added sugars, sodium content, and whole grains.  Dining Out - Part 1  Clinical staff conducted group or individual video education with verbal and written material and guidebook.  Patient learns that restaurant meals can be sabotaging because they can be so high in calories, fat, sodium, and/or sugar. Patient learns recommended strategies on how to positively address this and avoid unhealthy pitfalls.  Facts on Fats  Clinical staff conducted group or individual video education with verbal and written material and guidebook.  Patient learns that lifestyle modifications can be just as effective, if not more so, as many medications for lowering your risk of heart disease. A Pritikin lifestyle can help to reduce your risk of inflammation and atherosclerosis (cholesterol build-up, or plaque, in the artery walls). Lifestyle interventions such as dietary choices and physical activity address the cause of atherosclerosis. A review of the types of fats and their impact on blood cholesterol levels, along with dietary recommendations to reduce fat intake is also included.  Nutrition Action Plan  Clinical staff conducted group or individual video education with verbal and written material and guidebook.  Patient learns how to incorporate Pritikin recommendations into  their lifestyle. Recommendations include planning and keeping personal health goals in mind as an important part of their success.  Healthy Mind-Set    Healthy Minds, Bodies, Hearts  Clinical staff conducted group or individual video education with verbal and written material and guidebook.  Patient learns how to identify when they are stressed. Video will discuss the impact of that stress, as well as the many benefits of stress management. Patient will also be introduced to stress management techniques. The way we think, act, and feel has an impact on our hearts.  How Our Thoughts Can Heal Our Hearts  Clinical staff conducted group or individual video education with verbal and written material and guidebook.  Patient learns that negative thoughts can cause depression and anxiety. This can result in negative lifestyle behavior and serious health problems. Cognitive behavioral therapy is an effective method to help control our thoughts in order to change and  improve our emotional outlook.  Additional Videos:  Exercise    Improving Performance  Clinical staff conducted group or individual video education with verbal and written material and guidebook.  Patient learns to use a non-linear approach by alternating intensity levels and lengths of time spent exercising to help burn more calories and lose more body fat. Cardiovascular exercise helps improve heart health, metabolism, hormonal balance, blood sugar control, and recovery from fatigue. Resistance training improves strength, endurance, balance, coordination, reaction time, metabolism, and muscle mass. Flexibility exercise improves circulation, posture, and balance. Seek guidance from your physician and exercise physiologist before implementing an exercise routine and learn your capabilities and proper form for all exercise.  Introduction to Yoga  Clinical staff conducted group or individual video education with verbal and written material and  guidebook.  Patient learns about yoga, a discipline of the coming together of mind, breath, and body. The benefits of yoga include improved flexibility, improved range of motion, better posture and core strength, increased lung function, weight loss, and positive self-image. Yoga's heart health benefits include lowered blood pressure, healthier heart rate, decreased cholesterol and triglyceride levels, improved immune function, and reduced stress. Seek guidance from your physician and exercise physiologist before implementing an exercise routine and learn your capabilities and proper form for all exercise.  Medical   Aging: Enhancing Your Quality of Life  Clinical staff conducted group or individual video education with verbal and written material and guidebook.  Patient learns key strategies and recommendations to stay in good physical health and enhance quality of life, such as prevention strategies, having an advocate, securing a Health Care Proxy and Power of Attorney, and keeping a list of medications and system for tracking them. It also discusses how to avoid risk for bone loss.  Biology of Weight Control  Clinical staff conducted group or individual video education with verbal and written material and guidebook.  Patient learns that weight gain occurs because we consume more calories than we burn (eating more, moving less). Even if your body weight is normal, you may have higher ratios of fat compared to muscle mass. Too much body fat puts you at increased risk for cardiovascular disease, heart attack, stroke, type 2 diabetes, and obesity-related cancers. In addition to exercise, following the Pritikin Eating Plan can help reduce your risk.  Decoding Lab Results  Clinical staff conducted group or individual video education with verbal and written material and guidebook.  Patient learns that lab test reflects one measurement whose values change over time and are influenced by many factors,  including medication, stress, sleep, exercise, food, hydration, pre-existing medical conditions, and more. It is recommended to use the knowledge from this video to become more involved with your lab results and evaluate your numbers to speak with your doctor.   Diseases of Our Time - Overview  Clinical staff conducted group or individual video education with verbal and written material and guidebook.  Patient learns that according to the CDC, 50% to 70% of chronic diseases (such as obesity, type 2 diabetes, elevated lipids, hypertension, and heart disease) are avoidable through lifestyle improvements including healthier food choices, listening to satiety cues, and increased physical activity.  Sleep Disorders Clinical staff conducted group or individual video education with verbal and written material and guidebook.  Patient learns how good quality and duration of sleep are important to overall health and well-being. Patient also learns about sleep disorders and how they impact health along with recommendations to address them, including discussing with a  physician.  Nutrition  Dining Out - Part 2 Clinical staff conducted group or individual video education with verbal and written material and guidebook.  Patient learns how to plan ahead and communicate in order to maximize their dining experience in a healthy and nutritious manner. Included are recommended food choices based on the type of restaurant the patient is visiting.   Fueling a Banker conducted group or individual video education with verbal and written material and guidebook.  There is a strong connection between our food choices and our health. Diseases like obesity and type 2 diabetes are very prevalent and are in large-part due to lifestyle choices. The Pritikin Eating Plan provides plenty of food and hunger-curbing satisfaction. It is easy to follow, affordable, and helps reduce health risks.  Menu Workshop   Clinical staff conducted group or individual video education with verbal and written material and guidebook.  Patient learns that restaurant meals can sabotage health goals because they are often packed with calories, fat, sodium, and sugar. Recommendations include strategies to plan ahead and to communicate with the manager, chef, or server to help order a healthier meal.  Planning Your Eating Strategy  Clinical staff conducted group or individual video education with verbal and written material and guidebook.  Patient learns about the Pritikin Eating Plan and its benefit of reducing the risk of disease. The Pritikin Eating Plan does not focus on calories. Instead, it emphasizes high-quality, nutrient-rich foods. By knowing the characteristics of the foods, we choose, we can determine their calorie density and make informed decisions.  Targeting Your Nutrition Priorities  Clinical staff conducted group or individual video education with verbal and written material and guidebook.  Patient learns that lifestyle habits have a tremendous impact on disease risk and progression. This video provides eating and physical activity recommendations based on your personal health goals, such as reducing LDL cholesterol, losing weight, preventing or controlling type 2 diabetes, and reducing high blood pressure.  Vitamins and Minerals  Clinical staff conducted group or individual video education with verbal and written material and guidebook.  Patient learns different ways to obtain key vitamins and minerals, including through a recommended healthy diet. It is important to discuss all supplements you take with your doctor.   Healthy Mind-Set    Smoking Cessation  Clinical staff conducted group or individual video education with verbal and written material and guidebook.  Patient learns that cigarette smoking and tobacco addiction pose a serious health risk which affects millions of people. Stopping smoking  will significantly reduce the risk of heart disease, lung disease, and many forms of cancer. Recommended strategies for quitting are covered, including working with your doctor to develop a successful plan.  Culinary   Becoming a Set designer conducted group or individual video education with verbal and written material and guidebook.  Patient learns that cooking at home can be healthy, cost-effective, quick, and puts them in control. Keys to cooking healthy recipes will include looking at your recipe, assessing your equipment needs, planning ahead, making it simple, choosing cost-effective seasonal ingredients, and limiting the use of added fats, salts, and sugars.  Cooking - Breakfast and Snacks  Clinical staff conducted group or individual video education with verbal and written material and guidebook.  Patient learns how important breakfast is to satiety and nutrition through the entire day. Recommendations include key foods to eat during breakfast to help stabilize blood sugar levels and to prevent overeating at meals later in the  day. Planning ahead is also a key component.  Cooking - Educational psychologist conducted group or individual video education with verbal and written material and guidebook.  Patient learns eating strategies to improve overall health, including an approach to cook more at home. Recommendations include thinking of animal protein as a side on your plate rather than center stage and focusing instead on lower calorie dense options like vegetables, fruits, whole grains, and plant-based proteins, such as beans. Making sauces in large quantities to freeze for later and leaving the skin on your vegetables are also recommended to maximize your experience.  Cooking - Healthy Salads and Dressing Clinical staff conducted group or individual video education with verbal and written material and guidebook.  Patient learns that vegetables, fruits, whole  grains, and legumes are the foundations of the Pritikin Eating Plan. Recommendations include how to incorporate each of these in flavorful and healthy salads, and how to create homemade salad dressings. Proper handling of ingredients is also covered. Cooking - Soups and [employer] - Soups and Desserts Clinical staff conducted group or individual video education with verbal and written material and guidebook.  Patient learns that Pritikin soups and desserts make for easy, nutritious, and delicious snacks and meal components that are low in sodium, fat, sugar, and calorie density, while high in vitamins, minerals, and filling fiber. Recommendations include simple and healthy ideas for soups and desserts.   Overview     The Pritikin Solution Program Overview Clinical staff conducted group or individual video education with verbal and written material and guidebook.  Patient learns that the results of the Pritikin Program have been documented in more than 100 articles published in peer-reviewed journals, and the benefits include reducing risk factors for (and, in some cases, even reversing) high cholesterol, high blood pressure, type 2 diabetes, obesity, and more! An overview of the three key pillars of the Pritikin Program will be covered: eating well, doing regular exercise, and having a healthy mind-set.  WORKSHOPS  Exercise: Exercise Basics: Building Your Action Plan Clinical staff led group instruction and group discussion with PowerPoint presentation and patient guidebook. To enhance the learning environment the use of posters, models and videos may be added. At the conclusion of this workshop, patients will comprehend the difference between physical activity and exercise, as well as the benefits of incorporating both, into their routine. Patients will understand the FITT (Frequency, Intensity, Time, and Type) principle and how to use it to build an exercise action plan. In addition,  safety concerns and other considerations for exercise and cardiac rehab will be addressed by the presenter. The purpose of this lesson is to promote a comprehensive and effective weekly exercise routine in order to improve patients' overall level of fitness.   Managing Heart Disease: Your Path to a Healthier Heart Clinical staff led group instruction and group discussion with PowerPoint presentation and patient guidebook. To enhance the learning environment the use of posters, models and videos may be added.At the conclusion of this workshop, patients will understand the anatomy and physiology of the heart. Additionally, they will understand how Pritikin's three pillars impact the risk factors, the progression, and the management of heart disease.  The purpose of this lesson is to provide a high-level overview of the heart, heart disease, and how the Pritikin lifestyle positively impacts risk factors.  Exercise Biomechanics Clinical staff led group instruction and group discussion with PowerPoint presentation and patient guidebook. To enhance the learning environment the use of  posters, models and videos may be added. Patients will learn how the structural parts of their bodies function and how these functions impact their daily activities, movement, and exercise. Patients will learn how to promote a neutral spine, learn how to manage pain, and identify ways to improve their physical movement in order to promote healthy living. The purpose of this lesson is to expose patients to common physical limitations that impact physical activity. Participants will learn practical ways to adapt and manage aches and pains, and to minimize their effect on regular exercise. Patients will learn how to maintain good posture while sitting, walking, and lifting.  Balance Training and Fall Prevention  Clinical staff led group instruction and group discussion with PowerPoint presentation and patient guidebook. To  enhance the learning environment the use of posters, models and videos may be added. At the conclusion of this workshop, patients will understand the importance of their sensorimotor skills (vision, proprioception, and the vestibular system) in maintaining their ability to balance as they age. Patients will apply a variety of balancing exercises that are appropriate for their current level of function. Patients will understand the common causes for poor balance, possible solutions to these problems, and ways to modify their physical environment in order to minimize their fall risk. The purpose of this lesson is to teach patients about the importance of maintaining balance as they age and ways to minimize their risk of falling.  WORKSHOPS   Nutrition:  Fueling a Ship broker led group instruction and group discussion with PowerPoint presentation and patient guidebook. To enhance the learning environment the use of posters, models and videos may be added. Patients will review the foundational principles of the Pritikin Eating Plan and understand what constitutes a serving size in each of the food groups. Patients will also learn Pritikin-friendly foods that are better choices when away from home and review make-ahead meal and snack options. Calorie density will be reviewed and applied to three nutrition priorities: weight maintenance, weight loss, and weight gain. The purpose of this lesson is to reinforce (in a group setting) the key concepts around what patients are recommended to eat and how to apply these guidelines when away from home by planning and selecting Pritikin-friendly options. Patients will understand how calorie density may be adjusted for different weight management goals.  Mindful Eating  Clinical staff led group instruction and group discussion with PowerPoint presentation and patient guidebook. To enhance the learning environment the use of posters, models and videos may  be added. Patients will briefly review the concepts of the Pritikin Eating Plan and the importance of low-calorie dense foods. The concept of mindful eating will be introduced as well as the importance of paying attention to internal hunger signals. Triggers for non-hunger eating and techniques for dealing with triggers will be explored. The purpose of this lesson is to provide patients with the opportunity to review the basic principles of the Pritikin Eating Plan, discuss the value of eating mindfully and how to measure internal cues of hunger and fullness using the Hunger Scale. Patients will also discuss reasons for non-hunger eating and learn strategies to use for controlling emotional eating.  Targeting Your Nutrition Priorities Clinical staff led group instruction and group discussion with PowerPoint presentation and patient guidebook. To enhance the learning environment the use of posters, models and videos may be added. Patients will learn how to determine their genetic susceptibility to disease by reviewing their family history. Patients will gain insight into the importance  of diet as part of an overall healthy lifestyle in mitigating the impact of genetics and other environmental insults. The purpose of this lesson is to provide patients with the opportunity to assess their personal nutrition priorities by looking at their family history, their own health history and current risk factors. Patients will also be able to discuss ways of prioritizing and modifying the Pritikin Eating Plan for their highest risk areas  Menu  Clinical staff led group instruction and group discussion with PowerPoint presentation and patient guidebook. To enhance the learning environment the use of posters, models and videos may be added. Using menus brought in from [employer], or printed from [employer], patients will apply the Pritikin dining out guidelines that were presented in the [employer] video. Patients will also be able to practice these guidelines in a variety of provided scenarios. The purpose of this lesson is to provide patients with the opportunity to practice hands-on learning of the Pritikin Dining Out guidelines with actual menus and practice scenarios.  Label Reading Clinical staff led group instruction and group discussion with PowerPoint presentation and patient guidebook. To enhance the learning environment the use of posters, models and videos may be added. Patients will review and discuss the Pritikin label reading guidelines presented in Pritikin's Label Reading Educational series video. Using fool labels brought in from local grocery stores and markets, patients will apply the label reading guidelines and determine if the packaged food meet the Pritikin guidelines. The purpose of this lesson is to provide patients with the opportunity to review, discuss, and practice hands-on learning of the Pritikin Label Reading guidelines with actual packaged food labels. Cooking School  Pritikin's [employer] are designed to teach patients ways to prepare quick, simple, and affordable recipes at home. The importance of nutrition's role in chronic disease risk reduction is reflected in its emphasis in the overall Pritikin program. By learning how to prepare essential core Pritikin Eating Plan recipes, patients will increase control over what they eat; be able to customize the flavor of foods without the use of added salt, sugar, or fat; and improve the quality of the food they consume. By learning a set of core recipes which are easily assembled, quickly prepared, and affordable, patients are more likely to prepare more healthy foods at home. These workshops focus on convenient breakfasts, simple entres, side dishes, and desserts which can be prepared with minimal effort and are consistent with nutrition recommendations for cardiovascular risk reduction. Cooking  [employer] are taught by a Armed forces logistics/support/administrative officer (RD) who has been trained by the [employer]. The chef or RD has a clear understanding of the importance of minimizing - if not completely eliminating - added fat, sugar, and sodium in recipes. Throughout the series of Cooking School Workshop sessions, patients will learn about healthy ingredients and efficient methods of cooking to build confidence in their capability to prepare    Cooking School weekly topics:  Adding Flavor- Sodium-Free  Fast and Healthy Breakfasts  Powerhouse Plant-Based Proteins  Satisfying Salads and Dressings  Simple Sides and Sauces  International Cuisine-Spotlight on the [employer] Zones  Delicious Desserts  Savory Soups  [employer] - Meals in a Astronomer Appetizers and Snacks  Comforting Weekend Breakfasts  One-Pot Wonders   Fast Evening Meals  Landscape architect Your Pritikin Plate  WORKSHOPS   Healthy Mindset (Psychosocial):  Focused Goals, Sustainable Changes Clinical staff led group instruction and group discussion  with PowerPoint presentation and patient guidebook. To enhance the learning environment the use of posters, models and videos may be added. Patients will be able to apply effective goal setting strategies to establish at least one personal goal, and then take consistent, meaningful action toward that goal. They will learn to identify common barriers to achieving personal goals and develop strategies to overcome them. Patients will also gain an understanding of how our mind-set can impact our ability to achieve goals and the importance of cultivating a positive and growth-oriented mind-set. The purpose of this lesson is to provide patients with a deeper understanding of how to set and achieve personal goals, as well as the tools and strategies needed to overcome common obstacles which may arise along the way.  From Head to Heart: The Power of a Healthy  Outlook  Clinical staff led group instruction and group discussion with PowerPoint presentation and patient guidebook. To enhance the learning environment the use of posters, models and videos may be added. Patients will be able to recognize and describe the impact of emotions and mood on physical health. They will discover the importance of self-care and explore self-care practices which may work for them. Patients will also learn how to utilize the 4 C's to cultivate a healthier outlook and better manage stress and challenges. The purpose of this lesson is to demonstrate to patients how a healthy outlook is an essential part of maintaining good health, especially as they continue their cardiac rehab journey.  Healthy Sleep for a Healthy Heart Clinical staff led group instruction and group discussion with PowerPoint presentation and patient guidebook. To enhance the learning environment the use of posters, models and videos may be added. At the conclusion of this workshop, patients will be able to demonstrate knowledge of the importance of sleep to overall health, well-being, and quality of life. They will understand the symptoms of, and treatments for, common sleep disorders. Patients will also be able to identify daytime and nighttime behaviors which impact sleep, and they will be able to apply these tools to help manage sleep-related challenges. The purpose of this lesson is to provide patients with a general overview of sleep and outline the importance of quality sleep. Patients will learn about a few of the most common sleep disorders. Patients will also be introduced to the concept of "sleep hygiene," and discover ways to self-manage certain sleeping problems through simple daily behavior changes. Finally, the workshop will motivate patients by clarifying the links between quality sleep and their goals of heart-healthy living.   Recognizing and Reducing Stress Clinical staff led group instruction and  group discussion with PowerPoint presentation and patient guidebook. To enhance the learning environment the use of posters, models and videos may be added. At the conclusion of this workshop, patients will be able to understand the types of stress reactions, differentiate between acute and chronic stress, and recognize the impact that chronic stress has on their health. They will also be able to apply different coping mechanisms, such as reframing negative self-talk. Patients will have the opportunity to practice a variety of stress management techniques, such as deep abdominal breathing, progressive muscle relaxation, and/or guided imagery.  The purpose of this lesson is to educate patients on the role of stress in their lives and to provide healthy techniques for coping with it.  Learning Barriers/Preferences:  Learning Barriers/Preferences - 05/24/23 1610       Learning Barriers/Preferences   Learning Barriers None    Learning Preferences Written Material;Skilled  Demonstration;Pictoral;Video;Group Instruction;Individual Instruction             Education Topics:  Knowledge Questionnaire Score:  Knowledge Questionnaire Score - 05/24/23 1000       Knowledge Questionnaire Score   Pre Score 21/24             Core Components/Risk Factors/Patient Goals at Admission:  Personal Goals and Risk Factors at Admission - 05/24/23 0943       Core Components/Risk Factors/Patient Goals on Admission    Weight Management Yes;Weight Loss    Intervention Weight Management: Develop a combined nutrition and exercise program designed to reach desired caloric intake, while maintaining appropriate intake of nutrient and fiber, sodium and fats, and appropriate energy expenditure required for the weight goal.;Weight Management: Provide education and appropriate resources to help participant work on and attain dietary goals.;Weight Management/Obesity: Establish reasonable short term and long term weight  goals.;Obesity: Provide education and appropriate resources to help participant work on and attain dietary goals.    Goal Weight: Long Term 234 lb 3.2 oz (106.2 kg)    Hypertension Yes    Intervention Provide education on lifestyle modifcations including regular physical activity/exercise, weight management, moderate sodium restriction and increased consumption of fresh fruit, vegetables, and low fat dairy, alcohol moderation, and smoking cessation.;Monitor prescription use compliance.    Expected Outcomes Short Term: Continued assessment and intervention until BP is < 140/26mm HG in hypertensive participants. < 130/21mm HG in hypertensive participants with diabetes, heart failure or chronic kidney disease.;Long Term: Maintenance of blood pressure at goal levels.    Lipids Yes    Intervention Provide education and support for participant on nutrition & aerobic/resistive exercise along with prescribed medications to achieve LDL 70mg , HDL >40mg .    Expected Outcomes Short Term: Participant states understanding of desired cholesterol values and is compliant with medications prescribed. Participant is following exercise prescription and nutrition guidelines.;Long Term: Cholesterol controlled with medications as prescribed, with individualized exercise RX and with personalized nutrition plan. Value goals: LDL < 70mg , HDL > 40 mg.    Stress Yes    Intervention Offer individual and/or small group education and counseling on adjustment to heart disease, stress management and health-related lifestyle change. Teach and support self-help strategies.;Refer participants experiencing significant psychosocial distress to appropriate mental health specialists for further evaluation and treatment. When possible, include family members and significant others in education/counseling sessions.    Expected Outcomes Long Term: Emotional wellbeing is indicated by absence of clinically significant psychosocial distress or social  isolation.;Short Term: Participant demonstrates changes in health-related behavior, relaxation and other stress management skills, ability to obtain effective social support, and compliance with psychotropic medications if prescribed.    Personal Goal Other Yes    Personal Goal Short: more diet info, form exercise routine Long; flexibility and strength    Intervention Will continue to monitor pt and progress work loads as toleratred without sign or symptom    Expected Outcomes Pt will achieve his goals and gain strength             Core Components/Risk Factors/Patient Goals Review:    Core Components/Risk Factors/Patient Goals at Discharge (Final Review):    ITP Comments:  ITP Comments     Row Name 05/24/23 0756           ITP Comments Rondell Reams, MD: Medical Director.  Intorduction to the [employer]  Initial orientation packet reviewed with the patient.  Comments: Participant attended orientation for the cardiac rehabilitation program on  05/24/2023  to perform initial intake and exercise walk test. Patient introduced to the Pritikin Program education and orientation packet was reviewed. Completed 6-minute walk test, measurements, initial ITP, and exercise prescription. Vital signs stable. Telemetry-normal sinus rhythm, asymptomatic.   Service time was from [id number] to [phone].

## 2023-05-28 ENCOUNTER — Encounter (HOSPITAL_COMMUNITY)
Admission: RE | Admit: 2023-05-28 | Discharge: 2023-05-28 | Disposition: A | Discharge status: Home / Self Care | Payer: 59 | Source: Ambulatory Visit | Attending: Cardiovascular Disease | Admitting: Cardiovascular Disease

## 2023-05-28 DIAGNOSIS — Z955 Presence of coronary angioplasty implant and graft: Secondary | ICD-10-CM

## 2023-05-28 DIAGNOSIS — I2102 ST elevation (STEMI) myocardial infarction involving left anterior descending coronary artery: Secondary | ICD-10-CM

## 2023-05-28 DIAGNOSIS — I213 ST elevation (STEMI) myocardial infarction of unspecified site: Secondary | ICD-10-CM | POA: Diagnosis not present

## 2023-05-28 NOTE — Progress Notes (Signed)
Daily Session Note  Patient Details  Name: Christian Foster MRN: [id number] Date of Birth: [birth year] Referring Provider:   Flowsheet Row INTENSIVE CARDIAC REHAB ORIENT from 05/24/2023 in [facility]  Referring Provider Dr. Tonny Bollman MD       Encounter Date: 05/28/2023  Check In:  Session Check In - 05/28/23 0651       Check-In   Supervising physician immediately available to respond to emergencies [facility] MD immediately available    Physician(s) Jari Favre, PA    Location [facility]    Staff Present Raford Pitcher, MS, ACSM-CEP, Exercise Physiologist;Johnny Hale Bogus, MS, Exercise Physiologist;Bailey Wallace Cullens, MS, Exercise Physiologist;Brytni Dray Remus Loffler, RN, MHA    Virtual Visit No    Medication changes reported     No    Fall or balance concerns reported    No    Tobacco Cessation No Change    Warm-up and Cool-down Performed as group-led instruction    Resistance Training Performed Yes    VAD Patient? No    PAD/SET Patient? No      Pain Assessment   Currently in Pain? No/denies    Pain Score 0-No pain    Multiple Pain Sites No             Capillary Blood Glucose: No results found for this or any previous visit (from the past 24 hours).    Social History   Tobacco Use  Smoking Status Never  Smokeless Tobacco Never    Goals Met:  Independence with exercise equipment Exercise tolerated well No report of concerns or symptoms today Strength training completed today  Goals Unmet:  Not Applicable  Comments: Pt in today for cardiac rehab. Pt tolerated light exercise without difficulty. VSS, telemetry-Sinus rhythm, asymptomatic. Medication list reconciled on intake day last week with no reported changes this morning. Pt denies barriers to medication compliance.  PSYCHOSOCIAL ASSESSMENT:  PHQ9=7, reviewed with patient who reports doing much better since procedure and sleeping better. Has a  supportive family. Shared work has been main source contributing to the score and is trying to do better managing work related stressors. Patient denies depression and does not have the need for follow-up counseling. Pt exhibits positive coping skills, hopeful outlook with supportive family. No psychosocial needs identified at this time, no psychosocial interventions necessary. Pt oriented to exercise equipment and gym routine. Understanding verbalized.    Dr. Armanda Magic is Medical Director for Cardiac Rehab at [facility].

## 2023-06-01 ENCOUNTER — Encounter (HOSPITAL_COMMUNITY): Payer: 59

## 2023-06-04 ENCOUNTER — Encounter (HOSPITAL_COMMUNITY)
Admission: RE | Admit: 2023-06-04 | Discharge: 2023-06-04 | Disposition: A | Discharge status: Home / Self Care | Payer: 59 | Source: Ambulatory Visit | Attending: Cardiovascular Disease | Admitting: Cardiovascular Disease

## 2023-06-04 DIAGNOSIS — Z48812 Encounter for surgical aftercare following surgery on the circulatory system: Secondary | ICD-10-CM | POA: Diagnosis not present

## 2023-06-04 DIAGNOSIS — I2102 ST elevation (STEMI) myocardial infarction involving left anterior descending coronary artery: Secondary | ICD-10-CM

## 2023-06-04 DIAGNOSIS — Z955 Presence of coronary angioplasty implant and graft: Secondary | ICD-10-CM | POA: Insufficient documentation

## 2023-06-04 DIAGNOSIS — I252 Old myocardial infarction: Secondary | ICD-10-CM | POA: Insufficient documentation

## 2023-06-04 NOTE — Progress Notes (Deleted)
 Cardiac Individual Treatment Plan  Patient Details  Name: Christian Foster MRN: [id number] Date of Birth: Apr 07, 1971 Referring Provider:   Flowsheet Row INTENSIVE CARDIAC REHAB ORIENT from 05/24/2023 in [facility]  Referring Provider Dr. Ozell Fell MD       Initial Encounter Date:  Flowsheet Row INTENSIVE CARDIAC REHAB ORIENT from 05/24/2023 in [facility], Vascular, & Lung Health  Date 05/24/23       Visit Diagnosis: 04/28/23 Status post coronary artery stent placement LAD  04/28/23 ST elevation myocardial infarction involving left anterior descending (LAD) coronary artery (HCC)  Patient's Home Medications on Admission:  Current Outpatient Medications:    amLODipine  (NORVASC ) 5 MG tablet, Take 1 tablet (5 mg total) by mouth daily., Disp: 30 tablet, Rfl: 3   aspirin  EC 81 MG tablet, Take 1 tablet (81 mg total) by mouth daily. Swallow whole., Disp: 90 tablet, Rfl: 3   atorvastatin  (LIPITOR) 80 MG tablet, Take 1 tablet (80 mg total) by mouth daily., Disp: 90 tablet, Rfl: 3   escitalopram  (LEXAPRO ) 20 MG tablet, Take 20 mg by mouth daily., Disp: , Rfl:    nitroGLYCERIN  (NITROSTAT ) 0.4 MG SL tablet, Place 1 tablet (0.4 mg total) under the tongue every 5 (five) minutes x 3 doses as needed for chest pain., Disp: 25 tablet, Rfl: 0   prasugrel  (EFFIENT ) 10 MG TABS tablet, Take 1 tablet (10 mg total) by mouth daily. To be filled on 05/30/2023., Disp: 30 tablet, Rfl: 0  Past Medical History: Past Medical History:  Diagnosis Date   History of kidney stones    Sleep apnea    Pt states he was told after last surgery that he needed to be tested for sleep apnea. stated he stopped breathing and had to be bagged Pt has not had follow up testing    Tobacco Use: Social History   Tobacco Use  Smoking Status Never  Smokeless Tobacco Never    Labs: Review Flowsheet       Latest Ref Rng & Units  04/28/2023  Labs for ITP Cardiac and Pulmonary Rehab  Cholestrol 0 - 200 mg/dL 758  733   LDL (calc) 0 - 99 mg/dL 837  851   HDL-C >59 mg/dL 41  45   Trlycerides <849 mg/dL 809  634   Hemoglobin J8r 4.8 - 5.6 % 5.3  5.2   TCO2 22 - 32 mmol/L 25     Details       Multiple values from one day are sorted in reverse-chronological order         Capillary Blood Glucose: No results found for: GLUCAP   Exercise Target Goals: Exercise Program Goal: Individual exercise prescription set using results from initial 6 min walk test and THRR while considering  patient's activity barriers and safety.   Exercise Prescription Goal: Starting with aerobic activity 30 plus minutes a day, 3 days per week for initial exercise prescription. Provide home exercise prescription and guidelines that participant acknowledges understanding prior to discharge.  Activity Barriers & Risk Stratification:  Activity Barriers & Cardiac Risk Stratification - 05/24/23 0939       Activity Barriers & Cardiac Risk Stratification   Activity Barriers None    Cardiac Risk Stratification High   under 5 MET's on pre            6 Minute Walk:  6 Minute Walk     Row Name 05/24/23 1014  6 Minute Walk   Phase Initial     Distance 1370 feet     Walk Time 6 minutes     # of Rest Breaks 0     MPH 2.59     METS 3.52     RPE 11     Perceived Dyspnea  0     VO2 Peak 12.3     Symptoms No     Resting HR 76 bpm     Resting BP 118/78     Resting Oxygen Saturation  98 %     Exercise Oxygen Saturation  during 6 min walk 98 %     Max Ex. HR 91 bpm     Max Ex. BP 134/80     2 Minute Post BP 120/78              Oxygen Initial Assessment:   Oxygen Re-Evaluation:   Oxygen Discharge (Final Oxygen Re-Evaluation):   Initial Exercise Prescription:  Initial Exercise Prescription - 05/24/23 1000       Date of Initial Exercise RX and Referring Provider   Date 05/24/23    Referring  Provider Dr. Ozell Fell MD    Expected Discharge Date 08/15/23      NuStep   Level 2    SPM 80    Minutes 15    METs 2      Recumbant Elliptical   Level 1    RPM 40    Watts 60    Minutes 15    METs 2      Prescription Details   Frequency (times per week) 3    Duration Progress to 30 minutes of continuous aerobic without signs/symptoms of physical distress      Intensity   THRR 40-80% of Max Heartrate 68-136    Ratings of Perceived Exertion 11-13    Perceived Dyspnea 0-4      Progression   Progression Continue progressive overload as per policy without signs/symptoms or physical distress.      Resistance Training   Training Prescription Yes    Weight 3    Reps 10-15             Perform Capillary Blood Glucose checks as needed.  Exercise Prescription Changes:   Exercise Prescription Changes     Row Name 05/28/23 0800             Response to Exercise   Blood Pressure (Admit) 114/74       Blood Pressure (Exercise) 124/70       Blood Pressure (Exit) 102/66       Heart Rate (Admit) 74 bpm       Heart Rate (Exercise) 112 bpm       Heart Rate (Exit) 75 bpm       Rating of Perceived Exertion (Exercise) 11       Symptoms None       Comments Pt first day       Duration Continue with 30 min of aerobic exercise without signs/symptoms of physical distress.       Intensity THRR unchanged         Progression   Progression Continue to progress workloads to maintain intensity without signs/symptoms of physical distress.       Average METs 2.8         Resistance Training   Training Prescription Yes       Weight 3       Reps 10-15  NuStep   Level 2       SPM 95       Minutes 15       METs 2.2         Recumbant Elliptical   Level 1       RPM 56       Watts 99       Minutes 15       METs 3.3                Exercise Comments:   Exercise Comments     Row Name 05/28/23 [phone]           Exercise Comments Pt first day in program, pt  tolerated exercise without unusual signs or symptoms. Will continue to watch and increase workload as tolerated.                Exercise Goals and Review:   Exercise Goals     Row Name 05/24/23 0758             Exercise Goals   Increase Physical Activity Yes       Intervention Provide advice, education, support and counseling about physical activity/exercise needs.;Develop an individualized exercise prescription for aerobic and resistive training based on initial evaluation findings, risk stratification, comorbidities and participant's personal goals.       Expected Outcomes Short Term: Attend rehab on a regular basis to increase amount of physical activity.;Long Term: Add in home exercise to make exercise part of routine and to increase amount of physical activity.;Long Term: Exercising regularly at least 3-5 days a week.       Increase Strength and Stamina Yes       Intervention Provide advice, education, support and counseling about physical activity/exercise needs.;Develop an individualized exercise prescription for aerobic and resistive training based on initial evaluation findings, risk stratification, comorbidities and participant's personal goals.       Expected Outcomes Short Term: Increase workloads from initial exercise prescription for resistance, speed, and METs.;Short Term: Perform resistance training exercises routinely during rehab and add in resistance training at home;Long Term: Improve cardiorespiratory fitness, muscular endurance and strength as measured by increased METs and functional capacity ( )       Able to understand and use rate of perceived exertion (RPE) scale Yes       Intervention Provide education and explanation on how to use RPE scale       Expected Outcomes Short Term: Able to use RPE daily in rehab to express subjective intensity level;Long Term:  Able to use RPE to guide intensity level when exercising independently       Knowledge and  understanding of Target Heart Rate Range (THRR) Yes       Intervention Provide education and explanation of THRR including how the numbers were predicted and where they are located for reference       Expected Outcomes Short Term: Able to state/look up THRR;Long Term: Able to use THRR to govern intensity when exercising independently;Short Term: Able to use daily as guideline for intensity in rehab       Understanding of Exercise Prescription Yes       Intervention Provide education, explanation, and written materials on patient's individual exercise prescription       Expected Outcomes Short Term: Able to explain program exercise prescription;Long Term: Able to explain home exercise prescription to exercise independently                Exercise  Goals Re-Evaluation :  Exercise Goals Re-Evaluation     Row Name 05/28/23 0837             Exercise Goal Re-Evaluation   Exercise Goals Review Increase Physical Activity;Understanding of Exercise Prescription;Increase Strength and Stamina;Knowledge and understanding of Target Heart Rate Range (THRR);Able to understand and use rate of perceived exertion (RPE) scale       Comments Pt first day in program, pt tolerated exercise without unusual signs or symptoms. Pt averaged 2.8 Mets first day, will continue to monitor and increase workload as tolerated.       Expected Outcomes Will continue to monitor and increase workload as tolerared.                 Discharge Exercise Prescription (Final Exercise Prescription Changes):  Exercise Prescription Changes - 05/28/23 0800       Response to Exercise   Blood Pressure (Admit) 114/74    Blood Pressure (Exercise) 124/70    Blood Pressure (Exit) 102/66    Heart Rate (Admit) 74 bpm    Heart Rate (Exercise) 112 bpm    Heart Rate (Exit) 75 bpm    Rating of Perceived Exertion (Exercise) 11    Symptoms None    Comments Pt first day    Duration Continue with 30 min of aerobic exercise without  signs/symptoms of physical distress.    Intensity THRR unchanged      Progression   Progression Continue to progress workloads to maintain intensity without signs/symptoms of physical distress.    Average METs 2.8      Resistance Training   Training Prescription Yes    Weight 3    Reps 10-15      NuStep   Level 2    SPM 95    Minutes 15    METs 2.2      Recumbant Elliptical   Level 1    RPM 56    Watts 99    Minutes 15    METs 3.3             Nutrition:  Target Goals: Understanding of nutrition guidelines, daily intake of sodium 1500mg , cholesterol 200mg , calories 30% from fat and 7% or less from saturated fats, daily to have 5 or more servings of fruits and vegetables.  Biometrics:  Pre Biometrics - 05/24/23 0917       Pre Biometrics   Waist Circumference 43 inches    Hip Circumference 45 inches    Waist to Hip Ratio 0.96 %    Triceps Skinfold 12 mm    % Body Fat 31 %    Grip Strength 55 kg    Flexibility 4 in    Single Leg Stand 30 seconds              Nutrition Therapy Plan and Nutrition Goals:   Nutrition Assessments:  Nutrition Assessments - 05/24/23 0956       Rate Your Plate Scores   Pre Score 57            MEDIFICTS Score Key: >=70 Need to make dietary changes  40-70 Heart Healthy Diet <= 40 Therapeutic Level Cholesterol Diet  Flowsheet Row INTENSIVE CARDIAC REHAB ORIENT from 05/24/2023 in [facility], [facility], & Lung [facility]  Picture Your Plate Total Score on Admission 57      Picture Your Plate Scores: <59 Unhealthy dietary pattern with much room for improvement. 41-50 Dietary pattern unlikely to meet recommendations for  good health and room for improvement. 51-60 More healthful dietary pattern, with some room for improvement.  >60 Healthy dietary pattern, although there may be some specific behaviors that could be improved.    Nutrition Goals Re-Evaluation:   Nutrition Goals  Discharge (Final Nutrition Goals Re-Evaluation):   Psychosocial: Target Goals: Acknowledge presence or absence of significant depression and/or stress, maximize coping skills, provide positive support system. Participant is able to verbalize types and ability to use techniques and skills needed for reducing stress and depression.  Initial Review & Psychosocial Screening:  Initial Psych Review & Screening - 05/24/23 0940       Initial Review   Current issues with Current Anxiety/Panic;Current Stress Concerns;Current Sleep Concerns    Source of Stress Concerns Family    Comments Pt feels so anxiety and stress due to work and family, But has a good support system with his family and work friends. Pt has no additional needs at this time.      Family Dynamics   Good Support System? Yes      Screening Interventions   Interventions Encouraged to exercise;Provide feedback about the scores to participant    Expected Outcomes Long Term Goal: Stressors or current issues are controlled or eliminated.;Short Term goal: Identification and review with participant of any Quality of Life or Depression concerns found by scoring the questionnaire.;Long Term goal: The participant improves quality of Life and PHQ9 Scores as seen by post scores and/or verbalization of changes             Quality of Life Scores:  Quality of Life - 05/24/23 0947       Quality of Life   Select Quality of Life      Quality of Life Scores   Health/Function Pre 22.6 %    Socioeconomic Pre 23.97 %    Psych/Spiritual Pre 25.2 %    Family Pre 25.2 %    GLOBAL Pre 22.71 %            Scores of 19 and below usually indicate a poorer quality of life in these areas.  A difference of  2-3 points is a clinically meaningful difference.  A difference of 2-3 points in the total score of the Quality of Life Index has been associated with significant improvement in overall quality of life, self-image, physical symptoms, and  general health in studies assessing change in quality of life.  PHQ-9: Review Flowsheet       05/24/2023  Depression screen PHQ 2/9  Decreased Interest 2  Down, Depressed, Hopeless 0  PHQ - 2 Score 2  Altered sleeping 1  Tired, decreased energy 2  Change in appetite 0  Feeling bad or failure about yourself  0  Trouble concentrating 2  Moving slowly or fidgety/restless 0  Suicidal thoughts 0  PHQ-9 Score 7  Difficult doing work/chores Not difficult at all   Interpretation of Total Score  Total Score Depression Severity:  1-4 = Minimal depression, 5-9 = Mild depression, 10-14 = Moderate depression, 15-19 = Moderately severe depression, 20-27 = Severe depression   Psychosocial Evaluation and Intervention:   Psychosocial Re-Evaluation:  Psychosocial Re-Evaluation     Row Name 05/31/23 1056             Psychosocial Re-Evaluation   Current issues with Current Anxiety/Panic;Current Sleep Concerns;Current Stress Concerns       Comments PHQ2-9 review by DELENA Byes RN M[facility] on 05/28/23.PHQ9=7, reviewed with patient who reports doing much better since procedure and  sleeping better. Has a supportive family. Shared work has been main source contributing to the score and is trying to do better managing work related stressors. Patient denies depression and does not have the need for follow-up counseling. Pt exhibits positive coping skills, hopeful outlook with supportive family. No psychosocial needs identified at this time,       Expected Outcomes Kalen will have decreased on controlled anxiety/stressors upon completion of cardiac rehab.       Interventions Stress management education;Encouraged to attend Cardiac Rehabilitation for the exercise;Relaxation education       Continue Psychosocial Services  No Follow up required         Initial Review   Source of Stress Concerns Chronic Illness;Family;Occupation       Comments Will continue to montior and offer support as needed.                 Psychosocial Discharge (Final Psychosocial Re-Evaluation):  Psychosocial Re-Evaluation - 05/31/23 1056       Psychosocial Re-Evaluation   Current issues with Current Anxiety/Panic;Current Sleep Concerns;Current Stress Concerns    Comments PHQ2-9 review by DELENA Byes RN M[facility] on 05/28/23.PHQ9=7, reviewed with patient who reports doing much better since procedure and sleeping better. Has a supportive family. Shared work has been main source contributing to the score and is trying to do better managing work related stressors. Patient denies depression and does not have the need for follow-up counseling. Pt exhibits positive coping skills, hopeful outlook with supportive family. No psychosocial needs identified at this time,    Expected Outcomes Christian Foster will have decreased on controlled anxiety/stressors upon completion of cardiac rehab.    Interventions Stress management education;Encouraged to attend Cardiac Rehabilitation for the exercise;Relaxation education    Continue Psychosocial Services  No Follow up required      Initial Review   Source of Stress Concerns Chronic Illness;Family;Occupation    Comments Will continue to montior and offer support as needed.             Vocational Rehabilitation: Provide vocational rehab assistance to qualifying candidates.   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            Education: Education Goals: Education classes will be provided on a weekly basis, covering required topics. Participant will state understanding/return demonstration of topics presented.  Learning Barriers/Preferences:  Learning Barriers/Preferences - 05/24/23 0959       Learning Barriers/Preferences   Learning Barriers None    Learning Preferences Written  Material;Skilled Demonstration;Pictoral;Video;Group Instruction;Individual Instruction             Education Topics: Hypertension, Hypertension Reduction -Define heart disease and high blood pressure. Discus how high blood pressure affects the body and ways to reduce high blood pressure.   Exercise and Your Heart -Discuss why it is important to exercise, the FITT principles of exercise, normal and abnormal responses to exercise, and how to exercise safely.   Angina -Discuss definition of angina, causes of angina, treatment of angina, and how to decrease risk of having angina.   Cardiac Medications -Review what the following cardiac medications are used for, how they affect the body, and side effects that may occur when taking the medications.  Medications include Aspirin , Beta blockers, calcium  channel blockers, ACE Inhibitors, angiotensin receptor  blockers, diuretics, digoxin, and antihyperlipidemics.   Congestive Heart Failure -Discuss the definition of CHF, how to live with CHF, the signs and symptoms of CHF, and how keep track of weight and sodium intake.   Heart Disease and Intimacy -Discus the effect sexual activity has on the heart, how changes occur during intimacy as we age, and safety during sexual activity.   Smoking Cessation / COPD -Discuss different methods to quit smoking, the health benefits of quitting smoking, and the definition of COPD.   Nutrition I: Fats -Discuss the types of cholesterol, what cholesterol does to the heart, and how cholesterol levels can be controlled.   Nutrition II: Labels -Discuss the different components of food labels and how to read food label   Heart Parts/Heart Disease and PAD -Discuss the anatomy of the heart, the pathway of blood circulation through the heart, and these are affected by heart disease.   Stress I: Signs and Symptoms -Discuss the causes of stress, how stress may lead to anxiety and depression, and ways to  limit stress.   Stress II: Relaxation -Discuss different types of relaxation techniques to limit stress.   Warning Signs of Stroke / TIA -Discuss definition of a stroke, what the signs and symptoms are of a stroke, and how to identify when someone is having stroke.   Knowledge Questionnaire Score:  Knowledge Questionnaire Score - 05/24/23 1000       Knowledge Questionnaire Score   Pre Score 21/24             Core Components/Risk Factors/Patient Goals at Admission:  Personal Goals and Risk Factors at Admission - 05/24/23 0943       Core Components/Risk Factors/Patient Goals on Admission    Weight Management Yes;Weight Loss    Intervention Weight Management: Develop a combined nutrition and exercise program designed to reach desired caloric intake, while maintaining appropriate intake of nutrient and fiber, sodium and fats, and appropriate energy expenditure required for the weight goal.;Weight Management: Provide education and appropriate resources to help participant work on and attain dietary goals.;Weight Management/Obesity: Establish reasonable short term and long term weight goals.;Obesity: Provide education and appropriate resources to help participant work on and attain dietary goals.    Goal Weight: Long Term 234 lb 3.2 oz (106.2 kg)    Hypertension Yes    Intervention Provide education on lifestyle modifcations including regular physical activity/exercise, weight management, moderate sodium restriction and increased consumption of fresh fruit, vegetables, and low fat dairy, alcohol moderation, and smoking cessation.;Monitor prescription use compliance.    Expected Outcomes Short Term: Continued assessment and intervention until BP is < 140/16mm HG in hypertensive participants. < 130/69mm HG in hypertensive participants with diabetes, heart failure or chronic kidney disease.;Long Term: Maintenance of blood pressure at goal levels.    Lipids Yes    Intervention Provide  education and support for participant on nutrition & aerobic/resistive exercise along with prescribed medications to achieve LDL 70mg , HDL >40mg .    Expected Outcomes Short Term: Participant states understanding of desired cholesterol values and is compliant with medications prescribed. Participant is following exercise prescription and nutrition guidelines.;Long Term: Cholesterol controlled with medications as prescribed, with individualized exercise RX and with personalized nutrition plan. Value goals: LDL < 70mg , HDL > 40 mg.    Stress Yes    Intervention Offer individual and/or small group education and counseling on adjustment to heart disease, stress management and health-related lifestyle change. Teach and support self-help strategies.;Refer participants experiencing significant psychosocial distress to appropriate mental  health specialists for further evaluation and treatment. When possible, include family members and significant others in education/counseling sessions.    Expected Outcomes Long Term: Emotional wellbeing is indicated by absence of clinically significant psychosocial distress or social isolation.;Short Term: Participant demonstrates changes in health-related behavior, relaxation and other stress management skills, ability to obtain effective social support, and compliance with psychotropic medications if prescribed.    Personal Goal Other Yes    Personal Goal Short: more diet info, form exercise routine Long; flexibility and strength    Intervention Will continue to monitor pt and progress work loads as toleratred without sign or symptom    Expected Outcomes Pt will achieve his goals and gain strength             Core Components/Risk Factors/Patient Goals Review:   Goals and Risk Factor Review     Row Name 05/31/23 1100             Core Components/Risk Factors/Patient Goals Review   Personal Goals Review Weight Management/Obesity;Hypertension;Lipids;Stress        Review Nikolaos started cardiac rehab on 05/28/23. Makayla did well with exercise. Vital signs were stable       Expected Outcomes Christian Foster will continue to participate in cardiac rehab for exercise, nutrtion and lifestyle modifications                Core Components/Risk Factors/Patient Goals at Discharge (Final Review):   Goals and Risk Factor Review - 05/31/23 1100       Core Components/Risk Factors/Patient Goals Review   Personal Goals Review Weight Management/Obesity;Hypertension;Lipids;Stress    Review Christian Foster started cardiac rehab on 05/28/23. Christian Foster did well with exercise. Vital signs were stable    Expected Outcomes Dontai will continue to participate in cardiac rehab for exercise, nutrtion and lifestyle modifications             ITP Comments:  ITP Comments     Row Name 05/24/23 0756 05/31/23 1054         ITP Comments Wilbert Holland, MD: Medical Director.  Intorduction to the [employer]  Initial orientation packet reviewed with the patient. 30 Day ITP Review. Herminio started cardiac rehab on 05/28/23. Rasheed did well on his first day and is off to a good start to exercise.               Comments: See ITP comment.

## 2023-06-04 NOTE — Progress Notes (Signed)
 Cardiac Individual Treatment Plan  Patient Details  Name: Christian Foster MRN: [id number] Date of Birth: 03/21/1971 Referring Provider:   Flowsheet Row INTENSIVE CARDIAC REHAB ORIENT from 05/24/2023 in [facility] for Heart, Vascular, & Lung Health  Referring Provider Dr. Ozell Fell MD       Initial Encounter Date:  Flowsheet Row INTENSIVE CARDIAC REHAB ORIENT from 05/24/2023 in [facility] for Heart, Vascular, & Lung Health  Date 05/24/23       Visit Diagnosis: 04/28/23 Status post coronary artery stent placement LAD  04/28/23 ST elevation myocardial infarction involving left anterior descending (LAD) coronary artery (HCC)  Patient's Home Medications on Admission:  Current Outpatient Medications:    amLODipine  (NORVASC ) 5 MG tablet, Take 1 tablet (5 mg total) by mouth daily., Disp: 30 tablet, Rfl: 3   aspirin  EC 81 MG tablet, Take 1 tablet (81 mg total) by mouth daily. Swallow whole., Disp: 90 tablet, Rfl: 3   atorvastatin  (LIPITOR) 80 MG tablet, Take 1 tablet (80 mg total) by mouth daily., Disp: 90 tablet, Rfl: 3   escitalopram  (LEXAPRO ) 20 MG tablet, Take 20 mg by mouth daily., Disp: , Rfl:    nitroGLYCERIN  (NITROSTAT ) 0.4 MG SL tablet, Place 1 tablet (0.4 mg total) under the tongue every 5 (five) minutes x 3 doses as needed for chest pain., Disp: 25 tablet, Rfl: 0   prasugrel  (EFFIENT ) 10 MG TABS tablet, Take 1 tablet (10 mg total) by mouth daily. To be filled on 05/30/2023., Disp: 30 tablet, Rfl: 0  Past Medical History: Past Medical History:  Diagnosis Date   History of kidney stones    Sleep apnea    Pt states he was told after last surgery that he needed to be tested for sleep apnea. stated he stopped breathing and had to be bagged Pt has not had follow up testing    Tobacco Use: Social History   Tobacco Use  Smoking Status Never  Smokeless Tobacco Never    Labs: Review Flowsheet       Latest Ref Rng & Units  04/28/2023  Labs for ITP Cardiac and Pulmonary Rehab  Cholestrol 0 - 200 mg/dL 758  733   LDL (calc) 0 - 99 mg/dL 837  851   HDL-C >59 mg/dL 41  45   Trlycerides <849 mg/dL 809  634   Hemoglobin J8r 4.8 - 5.6 % 5.3  5.2   TCO2 22 - 32 mmol/L 25     Details       Multiple values from one day are sorted in reverse-chronological order         Capillary Blood Glucose: No results found for: GLUCAP   Exercise Target Goals: Exercise Program Goal: Individual exercise prescription set using results from initial 6 min walk test and THRR while considering  patient's activity barriers and safety.   Exercise Prescription Goal: Initial exercise prescription builds to 30-45 minutes a day of aerobic activity, 2-3 days per week.  Home exercise guidelines will be given to patient during program as part of exercise prescription that the participant will acknowledge.  Activity Barriers & Risk Stratification:  Activity Barriers & Cardiac Risk Stratification - 05/24/23 0939       Activity Barriers & Cardiac Risk Stratification   Activity Barriers None    Cardiac Risk Stratification High   under 5 MET's on pre            6 Minute Walk:  6 Minute Walk  Row Name 05/24/23 1014         6 Minute Walk   Phase Initial     Distance 1370 feet     Walk Time 6 minutes     # of Rest Breaks 0     MPH 2.59     METS 3.52     RPE 11     Perceived Dyspnea  0     VO2 Peak 12.3     Symptoms No     Resting HR 76 bpm     Resting BP 118/78     Resting Oxygen Saturation  98 %     Exercise Oxygen Saturation  during 6 min walk 98 %     Max Ex. HR 91 bpm     Max Ex. BP 134/80     2 Minute Post BP 120/78              Oxygen Initial Assessment:   Oxygen Re-Evaluation:   Oxygen Discharge (Final Oxygen Re-Evaluation):   Initial Exercise Prescription:  Initial Exercise Prescription - 05/24/23 1000       Date of Initial Exercise RX and Referring Provider   Date 05/24/23     Referring Provider Dr. Ozell Fell MD    Expected Discharge Date 08/15/23      NuStep   Level 2    SPM 80    Minutes 15    METs 2      Recumbant Elliptical   Level 1    RPM 40    Watts 60    Minutes 15    METs 2      Prescription Details   Frequency (times per week) 3    Duration Progress to 30 minutes of continuous aerobic without signs/symptoms of physical distress      Intensity   THRR 40-80% of Max Heartrate 68-136    Ratings of Perceived Exertion 11-13    Perceived Dyspnea 0-4      Progression   Progression Continue progressive overload as per policy without signs/symptoms or physical distress.      Resistance Training   Training Prescription Yes    Weight 3    Reps 10-15             Perform Capillary Blood Glucose checks as needed.  Exercise Prescription Changes:   Exercise Prescription Changes     Row Name 05/28/23 0800             Response to Exercise   Blood Pressure (Admit) 114/74       Blood Pressure (Exercise) 124/70       Blood Pressure (Exit) 102/66       Heart Rate (Admit) 74 bpm       Heart Rate (Exercise) 112 bpm       Heart Rate (Exit) 75 bpm       Rating of Perceived Exertion (Exercise) 11       Symptoms None       Comments Pt first day       Duration Continue with 30 min of aerobic exercise without signs/symptoms of physical distress.       Intensity THRR unchanged         Progression   Progression Continue to progress workloads to maintain intensity without signs/symptoms of physical distress.       Average METs 2.8         Resistance Training   Training Prescription Yes       Weight  3       Reps 10-15         NuStep   Level 2       SPM 95       Minutes 15       METs 2.2         Recumbant Elliptical   Level 1       RPM 56       Watts 99       Minutes 15       METs 3.3                Exercise Comments:   Exercise Comments     Row Name 05/28/23 [phone]           Exercise Comments Pt first day in  program, pt tolerated exercise without unusual signs or symptoms. Will continue to watch and increase workload as tolerated.                Exercise Goals and Review:   Exercise Goals     Row Name 05/24/23 0758             Exercise Goals   Increase Physical Activity Yes       Intervention Provide advice, education, support and counseling about physical activity/exercise needs.;Develop an individualized exercise prescription for aerobic and resistive training based on initial evaluation findings, risk stratification, comorbidities and participant's personal goals.       Expected Outcomes Short Term: Attend rehab on a regular basis to increase amount of physical activity.;Long Term: Add in home exercise to make exercise part of routine and to increase amount of physical activity.;Long Term: Exercising regularly at least 3-5 days a week.       Increase Strength and Stamina Yes       Intervention Provide advice, education, support and counseling about physical activity/exercise needs.;Develop an individualized exercise prescription for aerobic and resistive training based on initial evaluation findings, risk stratification, comorbidities and participant's personal goals.       Expected Outcomes Short Term: Increase workloads from initial exercise prescription for resistance, speed, and METs.;Short Term: Perform resistance training exercises routinely during rehab and add in resistance training at home;Long Term: Improve cardiorespiratory fitness, muscular endurance and strength as measured by increased METs and functional capacity ( )       Able to understand and use rate of perceived exertion (RPE) scale Yes       Intervention Provide education and explanation on how to use RPE scale       Expected Outcomes Short Term: Able to use RPE daily in rehab to express subjective intensity level;Long Term:  Able to use RPE to guide intensity level when exercising independently       Knowledge and  understanding of Target Heart Rate Range (THRR) Yes       Intervention Provide education and explanation of THRR including how the numbers were predicted and where they are located for reference       Expected Outcomes Short Term: Able to state/look up THRR;Long Term: Able to use THRR to govern intensity when exercising independently;Short Term: Able to use daily as guideline for intensity in rehab       Understanding of Exercise Prescription Yes       Intervention Provide education, explanation, and written materials on patient's individual exercise prescription       Expected Outcomes Short Term: Able to explain program exercise prescription;Long Term: Able to explain home exercise prescription to exercise  independently                Exercise Goals Re-Evaluation :  Exercise Goals Re-Evaluation     Row Name 05/28/23 0837             Exercise Goal Re-Evaluation   Exercise Goals Review Increase Physical Activity;Understanding of Exercise Prescription;Increase Strength and Stamina;Knowledge and understanding of Target Heart Rate Range (THRR);Able to understand and use rate of perceived exertion (RPE) scale       Comments Pt first day in program, pt tolerated exercise without unusual signs or symptoms. Pt averaged 2.8 Mets first day, will continue to monitor and increase workload as tolerated.       Expected Outcomes Will continue to monitor and increase workload as tolerared.                Discharge Exercise Prescription (Final Exercise Prescription Changes):  Exercise Prescription Changes - 05/28/23 0800       Response to Exercise   Blood Pressure (Admit) 114/74    Blood Pressure (Exercise) 124/70    Blood Pressure (Exit) 102/66    Heart Rate (Admit) 74 bpm    Heart Rate (Exercise) 112 bpm    Heart Rate (Exit) 75 bpm    Rating of Perceived Exertion (Exercise) 11    Symptoms None    Comments Pt first day    Duration Continue with 30 min of aerobic exercise without  signs/symptoms of physical distress.    Intensity THRR unchanged      Progression   Progression Continue to progress workloads to maintain intensity without signs/symptoms of physical distress.    Average METs 2.8      Resistance Training   Training Prescription Yes    Weight 3    Reps 10-15      NuStep   Level 2    SPM 95    Minutes 15    METs 2.2      Recumbant Elliptical   Level 1    RPM 56    Watts 99    Minutes 15    METs 3.3             Nutrition:  Target Goals: Understanding of nutrition guidelines, daily intake of sodium 1500mg , cholesterol 200mg , calories 30% from fat and 7% or less from saturated fats, daily to have 5 or more servings of fruits and vegetables.  Biometrics:  Pre Biometrics - 05/24/23 0917       Pre Biometrics   Waist Circumference 43 inches    Hip Circumference 45 inches    Waist to Hip Ratio 0.96 %    Triceps Skinfold 12 mm    % Body Fat 31 %    Grip Strength 55 kg    Flexibility 4 in    Single Leg Stand 30 seconds              Nutrition Therapy Plan and Nutrition Goals:   Nutrition Assessments:  Nutrition Assessments - 05/24/23 0956       Rate Your Plate Scores   Pre Score 57            MEDIFICTS Score Key: >=70 Need to make dietary changes  40-70 Heart Healthy Diet <= 40 Therapeutic Level Cholesterol Diet   Flowsheet Row INTENSIVE CARDIAC REHAB ORIENT from 05/24/2023 in [facility] for Heart, Vascular, & Lung Health  Picture Your Plate Total Score on Admission 57      Picture Your Plate Scores: <  40 Unhealthy dietary pattern with much room for improvement. 41-50 Dietary pattern unlikely to meet recommendations for good health and room for improvement. 51-60 More healthful dietary pattern, with some room for improvement.  >60 Healthy dietary pattern, although there may be some specific behaviors that could be improved.    Nutrition Goals Re-Evaluation:   Nutrition Goals  Re-Evaluation:   Nutrition Goals Discharge (Final Nutrition Goals Re-Evaluation):   Psychosocial: Target Goals: Acknowledge presence or absence of significant depression and/or stress, maximize coping skills, provide positive support system. Participant is able to verbalize types and ability to use techniques and skills needed for reducing stress and depression.  Initial Review & Psychosocial Screening:  Initial Psych Review & Screening - 05/24/23 0940       Initial Review   Current issues with Current Anxiety/Panic;Current Stress Concerns;Current Sleep Concerns    Source of Stress Concerns Family    Comments Pt feels so anxiety and stress due to work and family, But has a good support system with his family and work friends. Pt has no additional needs at this time.      Family Dynamics   Good Support System? Yes      Screening Interventions   Interventions Encouraged to exercise;Provide feedback about the scores to participant    Expected Outcomes Long Term Goal: Stressors or current issues are controlled or eliminated.;Short Term goal: Identification and review with participant of any Quality of Life or Depression concerns found by scoring the questionnaire.;Long Term goal: The participant improves quality of Life and PHQ9 Scores as seen by post scores and/or verbalization of changes             Quality of Life Scores:  Quality of Life - 05/24/23 0947       Quality of Life   Select Quality of Life      Quality of Life Scores   Health/Function Pre 22.6 %    Socioeconomic Pre 23.97 %    Psych/Spiritual Pre 25.2 %    Family Pre 25.2 %    GLOBAL Pre 22.71 %            Scores of 19 and below usually indicate a poorer quality of life in these areas.  A difference of  2-3 points is a clinically meaningful difference.  A difference of 2-3 points in the total score of the Quality of Life Index has been associated with significant improvement in overall quality of life,  self-image, physical symptoms, and general health in studies assessing change in quality of life.  PHQ-9: Review Flowsheet       05/24/2023  Depression screen PHQ 2/9  Decreased Interest 2  Down, Depressed, Hopeless 0  PHQ - 2 Score 2  Altered sleeping 1  Tired, decreased energy 2  Change in appetite 0  Feeling bad or failure about yourself  0  Trouble concentrating 2  Moving slowly or fidgety/restless 0  Suicidal thoughts 0  PHQ-9 Score 7  Difficult doing work/chores Not difficult at all   Interpretation of Total Score  Total Score Depression Severity:  1-4 = Minimal depression, 5-9 = Mild depression, 10-14 = Moderate depression, 15-19 = Moderately severe depression, 20-27 = Severe depression   Psychosocial Evaluation and Intervention:   Psychosocial Re-Evaluation:  Psychosocial Re-Evaluation     Row Name 05/31/23 1056             Psychosocial Re-Evaluation   Current issues with Current Anxiety/Panic;Current Sleep Concerns;Current Stress Concerns  Comments PHQ2-9 review by DELENA Byes RN MHA on 05/28/23.PHQ9=7, reviewed with patient who reports doing much better since procedure and sleeping better. Has a supportive family. Shared work has been main source contributing to the score and is trying to do better managing work related stressors. Patient denies depression and does not have the need for follow-up counseling. Pt exhibits positive coping skills, hopeful outlook with supportive family. No psychosocial needs identified at this time,       Expected Outcomes [place] will have decreased on controlled anxiety/stressors upon completion of cardiac rehab.       Interventions Stress management education;Encouraged to attend Cardiac Rehabilitation for the exercise;Relaxation education       Continue Psychosocial Services  No Follow up required         Initial Review   Source of Stress Concerns Chronic Illness;Family;Occupation       Comments Will continue to montior and  offer support as needed.                Psychosocial Discharge (Final Psychosocial Re-Evaluation):  Psychosocial Re-Evaluation - 05/31/23 1056       Psychosocial Re-Evaluation   Current issues with Current Anxiety/Panic;Current Sleep Concerns;Current Stress Concerns    Comments PHQ2-9 review by DELENA Byes RN MHA on 05/28/23.PHQ9=7, reviewed with patient who reports doing much better since procedure and sleeping better. Has a supportive family. Shared work has been main source contributing to the score and is trying to do better managing work related stressors. Patient denies depression and does not have the need for follow-up counseling. Pt exhibits positive coping skills, hopeful outlook with supportive family. No psychosocial needs identified at this time,    Expected Outcomes [place] will have decreased on controlled anxiety/stressors upon completion of cardiac rehab.    Interventions Stress management education;Encouraged to attend Cardiac Rehabilitation for the exercise;Relaxation education    Continue Psychosocial Services  No Follow up required      Initial Review   Source of Stress Concerns Chronic Illness;Family;Occupation    Comments Will continue to montior and offer support as needed.             Vocational Rehabilitation: Provide vocational rehab assistance to qualifying candidates.   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            Education: Education Goals: Education classes will be provided on a weekly basis, covering required topics. Participant will state understanding/return demonstration of topics presented.    Education     Row Name 05/28/23 0900     Education   Cardiac Education Topics Pritikin   Select Workshops     Workshops   Educator  Exercise Physiologist   Select Psychosocial   Psychosocial Workshop Focused Goals, Sustainable Changes   Instruction Review Code 1- Verbalizes Understanding   Class Start Time 0815   Class Stop Time 0848   Class Time Calculation (min) 33 min            Core Videos: Exercise    Move It!  Clinical staff conducted group or individual video education with verbal and written material and guidebook.  Patient learns the recommended Pritikin exercise program. Exercise with the goal of living a long, healthy life. Some of the health benefits of exercise include controlled diabetes, healthier  blood pressure levels, improved cholesterol levels, improved heart and lung capacity, improved sleep, and better body composition. Everyone should speak with their doctor before starting or changing an exercise routine.  Biomechanical Limitations Clinical staff conducted group or individual video education with verbal and written material and guidebook.  Patient learns how biomechanical limitations can impact exercise and how we can mitigate and possibly overcome limitations to have an impactful and balanced exercise routine.  Body Composition Clinical staff conducted group or individual video education with verbal and written material and guidebook.  Patient learns that body composition (ratio of muscle mass to fat mass) is a key component to assessing overall fitness, rather than body weight alone. Increased fat mass, especially visceral belly fat, can put us  at increased risk for metabolic syndrome, type 2 diabetes, heart disease, and even death. It is recommended to combine diet and exercise (cardiovascular and resistance training) to improve your body composition. Seek guidance from your physician and exercise physiologist before implementing an exercise routine.  Exercise Action Plan Clinical staff conducted group or individual video education with verbal and written material and guidebook.  Patient  learns the recommended strategies to achieve and enjoy long-term exercise adherence, including variety, self-motivation, self-efficacy, and positive decision making. Benefits of exercise include fitness, good health, weight management, more energy, better sleep, less stress, and overall well-being.  Medical   Heart Disease Risk Reduction Clinical staff conducted group or individual video education with verbal and written material and guidebook.  Patient learns our heart is our most vital organ as it circulates oxygen, nutrients, white blood cells, and hormones throughout the entire body, and carries waste away. Data supports a plant-based eating plan like the Pritikin Program for its effectiveness in slowing progression of and reversing heart disease. The video provides a number of recommendations to address heart disease.   Metabolic Syndrome and Belly Fat  Clinical staff conducted group or individual video education with verbal and written material and guidebook.  Patient learns what metabolic syndrome is, how it leads to heart disease, and how one can reverse it and keep it from coming back. You have metabolic syndrome if you have 3 of the following 5 criteria: abdominal obesity, high blood pressure, high triglycerides, low HDL cholesterol, and high blood sugar.  Hypertension and Heart Disease Clinical staff conducted group or individual video education with verbal and written material and guidebook.  Patient learns that high blood pressure, or hypertension, is very common in the [place] . Hypertension is largely due to excessive salt intake, but other important risk factors include being overweight, physical inactivity, drinking too much alcohol, smoking, and not eating enough potassium from fruits and vegetables. High blood pressure is a leading risk factor for heart attack, stroke, congestive heart failure, dementia, kidney failure, and premature death. Long-term effects of excessive salt  intake include stiffening of the arteries and thickening of heart muscle and organ damage. Recommendations include ways to reduce hypertension and the risk of heart disease.  Diseases of Our Time - Focusing on Diabetes Clinical staff conducted group or individual video education with verbal and written material and guidebook.  Patient learns why the best way to stop diseases of our time is prevention, through food and other lifestyle changes. Medicine (such as prescription pills and surgeries) is often only a Band-Aid on the problem, not a long-term solution. Most common diseases of our time include obesity, type 2 diabetes, hypertension, heart disease, and cancer. The Pritikin Program is recommended and has been proven to  help reduce, reverse, and/or prevent the damaging effects of metabolic syndrome.  Nutrition   Overview of the Pritikin Eating Plan  Clinical staff conducted group or individual video education with verbal and written material and guidebook.  Patient learns about the Pritikin Eating Plan for disease risk reduction. The Pritikin Eating Plan emphasizes a wide variety of unrefined, minimally-processed carbohydrates, like fruits, vegetables, whole grains, and legumes. Go, Caution, and Stop food choices are explained. Plant-based and lean animal proteins are emphasized. Rationale provided for low sodium intake for blood pressure control, low added sugars for blood sugar stabilization, and low added fats and oils for coronary artery disease risk reduction and weight management.  Calorie Density  Clinical staff conducted group or individual video education with verbal and written material and guidebook.  Patient learns about calorie density and how it impacts the Pritikin Eating Plan. Knowing the characteristics of the food you choose will help you decide whether those foods will lead to weight gain or weight loss, and whether you want to consume more or less of them. Weight loss is usually a  side effect of the Pritikin Eating Plan because of its focus on low calorie-dense foods.  Label Reading  Clinical staff conducted group or individual video education with verbal and written material and guidebook.  Patient learns about the Pritikin recommended label reading guidelines and corresponding recommendations regarding calorie density, added sugars, sodium content, and whole grains.  Dining Out - Part 1  Clinical staff conducted group or individual video education with verbal and written material and guidebook.  Patient learns that restaurant meals can be sabotaging because they can be so high in calories, fat, sodium, and/or sugar. Patient learns recommended strategies on how to positively address this and avoid unhealthy pitfalls.  Facts on Fats  Clinical staff conducted group or individual video education with verbal and written material and guidebook.  Patient learns that lifestyle modifications can be just as effective, if not more so, as many medications for lowering your risk of heart disease. A Pritikin lifestyle can help to reduce your risk of inflammation and atherosclerosis (cholesterol build-up, or plaque, in the artery walls). Lifestyle interventions such as dietary choices and physical activity address the cause of atherosclerosis. A review of the types of fats and their impact on blood cholesterol levels, along with dietary recommendations to reduce fat intake is also included.  Nutrition Action Plan  Clinical staff conducted group or individual video education with verbal and written material and guidebook.  Patient learns how to incorporate Pritikin recommendations into their lifestyle. Recommendations include planning and keeping personal health goals in mind as an important part of their success.  Healthy Mind-Set    Healthy Minds, Bodies, Hearts  Clinical staff conducted group or individual video education with verbal and written material and guidebook.  Patient  learns how to identify when they are stressed. Video will discuss the impact of that stress, as well as the many benefits of stress management. Patient will also be introduced to stress management techniques. The way we think, act, and feel has an impact on our hearts.  How Our Thoughts Can Heal Our Hearts  Clinical staff conducted group or individual video education with verbal and written material and guidebook.  Patient learns that negative thoughts can cause depression and anxiety. This can result in negative lifestyle behavior and serious health problems. Cognitive behavioral therapy is an effective method to help control our thoughts in order to change and improve our emotional outlook.  Additional  Videos:  Exercise    Improving Performance  Clinical staff conducted group or individual video education with verbal and written material and guidebook.  Patient learns to use a non-linear approach by alternating intensity levels and lengths of time spent exercising to help burn more calories and lose more body fat. Cardiovascular exercise helps improve heart health, metabolism, hormonal balance, blood sugar control, and recovery from fatigue. Resistance training improves strength, endurance, balance, coordination, reaction time, metabolism, and muscle mass. Flexibility exercise improves circulation, posture, and balance. Seek guidance from your physician and exercise physiologist before implementing an exercise routine and learn your capabilities and proper form for all exercise.  Introduction to Yoga  Clinical staff conducted group or individual video education with verbal and written material and guidebook.  Patient learns about yoga, a discipline of the coming together of mind, breath, and body. The benefits of yoga include improved flexibility, improved range of motion, better posture and core strength, increased lung function, weight loss, and positive self-image. Yoga's heart health benefits  include lowered blood pressure, healthier heart rate, decreased cholesterol and triglyceride levels, improved immune function, and reduced stress. Seek guidance from your physician and exercise physiologist before implementing an exercise routine and learn your capabilities and proper form for all exercise.  Medical   Aging: Enhancing Your Quality of Life  Clinical staff conducted group or individual video education with verbal and written material and guidebook.  Patient learns key strategies and recommendations to stay in good physical health and enhance quality of life, such as prevention strategies, having an advocate, securing a Health Care Proxy and Power of Attorney, and keeping a list of medications and system for tracking them. It also discusses how to avoid risk for bone loss.  Biology of Weight Control  Clinical staff conducted group or individual video education with verbal and written material and guidebook.  Patient learns that weight gain occurs because we consume more calories than we burn (eating more, moving less). Even if your body weight is normal, you may have higher ratios of fat compared to muscle mass. Too much body fat puts you at increased risk for cardiovascular disease, heart attack, stroke, type 2 diabetes, and obesity-related cancers. In addition to exercise, following the Pritikin Eating Plan can help reduce your risk.  Decoding Lab Results  Clinical staff conducted group or individual video education with verbal and written material and guidebook.  Patient learns that lab test reflects one measurement whose values change over time and are influenced by many factors, including medication, stress, sleep, exercise, food, hydration, pre-existing medical conditions, and more. It is recommended to use the knowledge from this video to become more involved with your lab results and evaluate your numbers to speak with your doctor.   Diseases of Our Time - Overview  Clinical  staff conducted group or individual video education with verbal and written material and guidebook.  Patient learns that according to the CDC, 50% to 70% of chronic diseases (such as obesity, type 2 diabetes, elevated lipids, hypertension, and heart disease) are avoidable through lifestyle improvements including healthier food choices, listening to satiety cues, and increased physical activity.  Sleep Disorders Clinical staff conducted group or individual video education with verbal and written material and guidebook.  Patient learns how good quality and duration of sleep are important to overall health and well-being. Patient also learns about sleep disorders and how they impact health along with recommendations to address them, including discussing with a physician.  Nutrition  Dining Out -  Part 2 Clinical staff conducted group or individual video education with verbal and written material and guidebook.  Patient learns how to plan ahead and communicate in order to maximize their dining experience in a healthy and nutritious manner. Included are recommended food choices based on the type of restaurant the patient is visiting.   Fueling a Banker conducted group or individual video education with verbal and written material and guidebook.  There is a strong connection between our food choices and our health. Diseases like obesity and type 2 diabetes are very prevalent and are in large-part due to lifestyle choices. The Pritikin Eating Plan provides plenty of food and hunger-curbing satisfaction. It is easy to follow, affordable, and helps reduce health risks.  Menu Workshop  Clinical staff conducted group or individual video education with verbal and written material and guidebook.  Patient learns that restaurant meals can sabotage health goals because they are often packed with calories, fat, sodium, and sugar. Recommendations include strategies to plan ahead and to  communicate with the manager, chef, or server to help order a healthier meal.  Planning Your Eating Strategy  Clinical staff conducted group or individual video education with verbal and written material and guidebook.  Patient learns about the Pritikin Eating Plan and its benefit of reducing the risk of disease. The Pritikin Eating Plan does not focus on calories. Instead, it emphasizes high-quality, nutrient-rich foods. By knowing the characteristics of the foods, we choose, we can determine their calorie density and make informed decisions.  Targeting Your Nutrition Priorities  Clinical staff conducted group or individual video education with verbal and written material and guidebook.  Patient learns that lifestyle habits have a tremendous impact on disease risk and progression. This video provides eating and physical activity recommendations based on your personal health goals, such as reducing LDL cholesterol, losing weight, preventing or controlling type 2 diabetes, and reducing high blood pressure.  Vitamins and Minerals  Clinical staff conducted group or individual video education with verbal and written material and guidebook.  Patient learns different ways to obtain key vitamins and minerals, including through a recommended healthy diet. It is important to discuss all supplements you take with your doctor.   Healthy Mind-Set    Smoking Cessation  Clinical staff conducted group or individual video education with verbal and written material and guidebook.  Patient learns that cigarette smoking and tobacco addiction pose a serious health risk which affects millions of people. Stopping smoking will significantly reduce the risk of heart disease, lung disease, and many forms of cancer. Recommended strategies for quitting are covered, including working with your doctor to develop a successful plan.  Culinary   Becoming a Set Designer conducted group or individual video  education with verbal and written material and guidebook.  Patient learns that cooking at home can be healthy, cost-effective, quick, and puts them in control. Keys to cooking healthy recipes will include looking at your recipe, assessing your equipment needs, planning ahead, making it simple, choosing cost-effective seasonal ingredients, and limiting the use of added fats, salts, and sugars.  Cooking - Breakfast and Snacks  Clinical staff conducted group or individual video education with verbal and written material and guidebook.  Patient learns how important breakfast is to satiety and nutrition through the entire day. Recommendations include key foods to eat during breakfast to help stabilize blood sugar levels and to prevent overeating at meals later in the day. Planning ahead is also a key  component.  Cooking - Educational Psychologist conducted group or individual video education with verbal and written material and guidebook.  Patient learns eating strategies to improve overall health, including an approach to cook more at home. Recommendations include thinking of animal protein as a side on your plate rather than center stage and focusing instead on lower calorie dense options like vegetables, fruits, whole grains, and plant-based proteins, such as beans. Making sauces in large quantities to freeze for later and leaving the skin on your vegetables are also recommended to maximize your experience.  Cooking - Healthy Salads and Dressing Clinical staff conducted group or individual video education with verbal and written material and guidebook.  Patient learns that vegetables, fruits, whole grains, and legumes are the foundations of the Pritikin Eating Plan. Recommendations include how to incorporate each of these in flavorful and healthy salads, and how to create homemade salad dressings. Proper handling of ingredients is also covered. Cooking - Soups and [employer] - Soups and  Desserts Clinical staff conducted group or individual video education with verbal and written material and guidebook.  Patient learns that Pritikin soups and desserts make for easy, nutritious, and delicious snacks and meal components that are low in sodium, fat, sugar, and calorie density, while high in vitamins, minerals, and filling fiber. Recommendations include simple and healthy ideas for soups and desserts.   Overview     The Pritikin Solution Program Overview Clinical staff conducted group or individual video education with verbal and written material and guidebook.  Patient learns that the results of the Pritikin Program have been documented in more than 100 articles published in peer-reviewed journals, and the benefits include reducing risk factors for (and, in some cases, even reversing) high cholesterol, high blood pressure, type 2 diabetes, obesity, and more! An overview of the three key pillars of the Pritikin Program will be covered: eating well, doing regular exercise, and having a healthy mind-set.  WORKSHOPS  Exercise: Exercise Basics: Building Your Action Plan Clinical staff led group instruction and group discussion with PowerPoint presentation and patient guidebook. To enhance the learning environment the use of posters, models and videos may be added. At the conclusion of this workshop, patients will comprehend the difference between physical activity and exercise, as well as the benefits of incorporating both, into their routine. Patients will understand the FITT (Frequency, Intensity, Time, and Type) principle and how to use it to build an exercise action plan. In addition, safety concerns and other considerations for exercise and cardiac rehab will be addressed by the presenter. The purpose of this lesson is to promote a comprehensive and effective weekly exercise routine in order to improve patients' overall level of fitness.   Managing Heart Disease: Your Path to a  Healthier Heart Clinical staff led group instruction and group discussion with PowerPoint presentation and patient guidebook. To enhance the learning environment the use of posters, models and videos may be added.At the conclusion of this workshop, patients will understand the anatomy and physiology of the heart. Additionally, they will understand how Pritikin's three pillars impact the risk factors, the progression, and the management of heart disease.  The purpose of this lesson is to provide a high-level overview of the heart, heart disease, and how the Pritikin lifestyle positively impacts risk factors.  Exercise Biomechanics Clinical staff led group instruction and group discussion with PowerPoint presentation and patient guidebook. To enhance the learning environment the use of posters, models and videos may be added.  Patients will learn how the structural parts of their bodies function and how these functions impact their daily activities, movement, and exercise. Patients will learn how to promote a neutral spine, learn how to manage pain, and identify ways to improve their physical movement in order to promote healthy living. The purpose of this lesson is to expose patients to common physical limitations that impact physical activity. Participants will learn practical ways to adapt and manage aches and pains, and to minimize their effect on regular exercise. Patients will learn how to maintain good posture while sitting, walking, and lifting.  Balance Training and Fall Prevention  Clinical staff led group instruction and group discussion with PowerPoint presentation and patient guidebook. To enhance the learning environment the use of posters, models and videos may be added. At the conclusion of this workshop, patients will understand the importance of their sensorimotor skills (vision, proprioception, and the vestibular system) in maintaining their ability to balance as they age.  Patients will apply a variety of balancing exercises that are appropriate for their current level of function. Patients will understand the common causes for poor balance, possible solutions to these problems, and ways to modify their physical environment in order to minimize their fall risk. The purpose of this lesson is to teach patients about the importance of maintaining balance as they age and ways to minimize their risk of falling.  WORKSHOPS   Nutrition:  Fueling a Ship Broker led group instruction and group discussion with PowerPoint presentation and patient guidebook. To enhance the learning environment the use of posters, models and videos may be added. Patients will review the foundational principles of the Pritikin Eating Plan and understand what constitutes a serving size in each of the food groups. Patients will also learn Pritikin-friendly foods that are better choices when away from home and review make-ahead meal and snack options. Calorie density will be reviewed and applied to three nutrition priorities: weight maintenance, weight loss, and weight gain. The purpose of this lesson is to reinforce (in a group setting) the key concepts around what patients are recommended to eat and how to apply these guidelines when away from home by planning and selecting Pritikin-friendly options. Patients will understand how calorie density may be adjusted for different weight management goals.  Mindful Eating  Clinical staff led group instruction and group discussion with PowerPoint presentation and patient guidebook. To enhance the learning environment the use of posters, models and videos may be added. Patients will briefly review the concepts of the Pritikin Eating Plan and the importance of low-calorie dense foods. The concept of mindful eating will be introduced as well as the importance of paying attention to internal hunger signals. Triggers for non-hunger eating and techniques  for dealing with triggers will be explored. The purpose of this lesson is to provide patients with the opportunity to review the basic principles of the Pritikin Eating Plan, discuss the value of eating mindfully and how to measure internal cues of hunger and fullness using the Hunger Scale. Patients will also discuss reasons for non-hunger eating and learn strategies to use for controlling emotional eating.  Targeting Your Nutrition Priorities Clinical staff led group instruction and group discussion with PowerPoint presentation and patient guidebook. To enhance the learning environment the use of posters, models and videos may be added. Patients will learn how to determine their genetic susceptibility to disease by reviewing their family history. Patients will gain insight into the importance of diet as part of an overall  healthy lifestyle in mitigating the impact of genetics and other environmental insults. The purpose of this lesson is to provide patients with the opportunity to assess their personal nutrition priorities by looking at their family history, their own health history and current risk factors. Patients will also be able to discuss ways of prioritizing and modifying the Pritikin Eating Plan for their highest risk areas  Menu  Clinical staff led group instruction and group discussion with PowerPoint presentation and patient guidebook. To enhance the learning environment the use of posters, models and videos may be added. Using menus brought in from [employer], or printed from [employer], patients will apply the Pritikin dining out guidelines that were presented in the [employer] video. Patients will also be able to practice these guidelines in a variety of provided scenarios. The purpose of this lesson is to provide patients with the opportunity to practice hands-on learning of the Pritikin Dining Out guidelines with actual menus and practice scenarios.  Label  Reading Clinical staff led group instruction and group discussion with PowerPoint presentation and patient guidebook. To enhance the learning environment the use of posters, models and videos may be added. Patients will review and discuss the Pritikin label reading guidelines presented in Pritikin's Label Reading Educational series video. Using fool labels brought in from local grocery stores and markets, patients will apply the label reading guidelines and determine if the packaged food meet the Pritikin guidelines. The purpose of this lesson is to provide patients with the opportunity to review, discuss, and practice hands-on learning of the Pritikin Label Reading guidelines with actual packaged food labels. Cooking School  Pritikin's [employer] are designed to teach patients ways to prepare quick, simple, and affordable recipes at home. The importance of nutrition's role in chronic disease risk reduction is reflected in its emphasis in the overall Pritikin program. By learning how to prepare essential core Pritikin Eating Plan recipes, patients will increase control over what they eat; be able to customize the flavor of foods without the use of added salt, sugar, or fat; and improve the quality of the food they consume. By learning a set of core recipes which are easily assembled, quickly prepared, and affordable, patients are more likely to prepare more healthy foods at home. These workshops focus on convenient breakfasts, simple entres, side dishes, and desserts which can be prepared with minimal effort and are consistent with nutrition recommendations for cardiovascular risk reduction. Cooking [employer] are taught by a armed forces logistics/support/administrative officer (RD) who has been trained by the [employer]. The chef or RD has a clear understanding of the importance of minimizing - if not completely eliminating - added fat, sugar, and sodium in recipes. Throughout the series of Cooking  School Workshop sessions, patients will learn about healthy ingredients and efficient methods of cooking to build confidence in their capability to prepare    Cooking School weekly topics:  Adding Flavor- Sodium-Free  Fast and Healthy Breakfasts  Powerhouse Plant-Based Proteins  Satisfying Salads and Dressings  Simple Sides and Sauces  International Cuisine-Spotlight on the [employer] Zones  Delicious Desserts  Savory Soups  [employer] - Meals in a Astronomer Appetizers and Snacks  Comforting Weekend Breakfasts  One-Pot Wonders   Fast Evening Meals  Landscape Architect Your Pritikin Plate  WORKSHOPS   Healthy Mindset (Psychosocial):  Focused Goals, Sustainable Changes Clinical staff led group instruction and group discussion with PowerPoint presentation and patient guidebook. To  enhance the learning environment the use of posters, models and videos may be added. Patients will be able to apply effective goal setting strategies to establish at least one personal goal, and then take consistent, meaningful action toward that goal. They will learn to identify common barriers to achieving personal goals and develop strategies to overcome them. Patients will also gain an understanding of how our mind-set can impact our ability to achieve goals and the importance of cultivating a positive and growth-oriented mind-set. The purpose of this lesson is to provide patients with a deeper understanding of how to set and achieve personal goals, as well as the tools and strategies needed to overcome common obstacles which may arise along the way.  From Head to Heart: The Power of a Healthy Outlook  Clinical staff led group instruction and group discussion with PowerPoint presentation and patient guidebook. To enhance the learning environment the use of posters, models and videos may be added. Patients will be able to recognize and describe the impact of emotions and mood on physical  health. They will discover the importance of self-care and explore self-care practices which may work for them. Patients will also learn how to utilize the 4 C's to cultivate a healthier outlook and better manage stress and challenges. The purpose of this lesson is to demonstrate to patients how a healthy outlook is an essential part of maintaining good health, especially as they continue their cardiac rehab journey.  Healthy Sleep for a Healthy Heart Clinical staff led group instruction and group discussion with PowerPoint presentation and patient guidebook. To enhance the learning environment the use of posters, models and videos may be added. At the conclusion of this workshop, patients will be able to demonstrate knowledge of the importance of sleep to overall health, well-being, and quality of life. They will understand the symptoms of, and treatments for, common sleep disorders. Patients will also be able to identify daytime and nighttime behaviors which impact sleep, and they will be able to apply these tools to help manage sleep-related challenges. The purpose of this lesson is to provide patients with a general overview of sleep and outline the importance of quality sleep. Patients will learn about a few of the most common sleep disorders. Patients will also be introduced to the concept of "sleep hygiene," and discover ways to self-manage certain sleeping problems through simple daily behavior changes. Finally, the workshop will motivate patients by clarifying the links between quality sleep and their goals of heart-healthy living.   Recognizing and Reducing Stress Clinical staff led group instruction and group discussion with PowerPoint presentation and patient guidebook. To enhance the learning environment the use of posters, models and videos may be added. At the conclusion of this workshop, patients will be able to understand the types of stress reactions, differentiate between acute and chronic  stress, and recognize the impact that chronic stress has on their health. They will also be able to apply different coping mechanisms, such as reframing negative self-talk. Patients will have the opportunity to practice a variety of stress management techniques, such as deep abdominal breathing, progressive muscle relaxation, and/or guided imagery.  The purpose of this lesson is to educate patients on the role of stress in their lives and to provide healthy techniques for coping with it.  Learning Barriers/Preferences:  Learning Barriers/Preferences - 05/24/23 0959       Learning Barriers/Preferences   Learning Barriers None    Learning Preferences Written Material;Skilled Demonstration;Pictoral;Video;Group Instruction;Individual Instruction  Education Topics:  Knowledge Questionnaire Score:  Knowledge Questionnaire Score - 05/24/23 1000       Knowledge Questionnaire Score   Pre Score 21/24             Core Components/Risk Factors/Patient Goals at Admission:  Personal Goals and Risk Factors at Admission - 05/24/23 0943       Core Components/Risk Factors/Patient Goals on Admission    Weight Management Yes;Weight Loss    Intervention Weight Management: Develop a combined nutrition and exercise program designed to reach desired caloric intake, while maintaining appropriate intake of nutrient and fiber, sodium and fats, and appropriate energy expenditure required for the weight goal.;Weight Management: Provide education and appropriate resources to help participant work on and attain dietary goals.;Weight Management/Obesity: Establish reasonable short term and long term weight goals.;Obesity: Provide education and appropriate resources to help participant work on and attain dietary goals.    Goal Weight: Long Term 234 lb 3.2 oz (106.2 kg)    Hypertension Yes    Intervention Provide education on lifestyle modifcations including regular physical activity/exercise, weight  management, moderate sodium restriction and increased consumption of fresh fruit, vegetables, and low fat dairy, alcohol moderation, and smoking cessation.;Monitor prescription use compliance.    Expected Outcomes Short Term: Continued assessment and intervention until BP is < 140/53mm HG in hypertensive participants. < 130/14mm HG in hypertensive participants with diabetes, heart failure or chronic kidney disease.;Long Term: Maintenance of blood pressure at goal levels.    Lipids Yes    Intervention Provide education and support for participant on nutrition & aerobic/resistive exercise along with prescribed medications to achieve LDL 70mg , HDL >40mg .    Expected Outcomes Short Term: Participant states understanding of desired cholesterol values and is compliant with medications prescribed. Participant is following exercise prescription and nutrition guidelines.;Long Term: Cholesterol controlled with medications as prescribed, with individualized exercise RX and with personalized nutrition plan. Value goals: LDL < 70mg , HDL > 40 mg.    Stress Yes    Intervention Offer individual and/or small group education and counseling on adjustment to heart disease, stress management and health-related lifestyle change. Teach and support self-help strategies.;Refer participants experiencing significant psychosocial distress to appropriate mental health specialists for further evaluation and treatment. When possible, include family members and significant others in education/counseling sessions.    Expected Outcomes Long Term: Emotional wellbeing is indicated by absence of clinically significant psychosocial distress or social isolation.;Short Term: Participant demonstrates changes in health-related behavior, relaxation and other stress management skills, ability to obtain effective social support, and compliance with psychotropic medications if prescribed.    Personal Goal Other Yes    Personal Goal Short: more diet  info, form exercise routine Long; flexibility and strength    Intervention Will continue to monitor pt and progress work loads as toleratred without sign or symptom    Expected Outcomes Pt will achieve his goals and gain strength             Core Components/Risk Factors/Patient Goals Review:   Goals and Risk Factor Review     Row Name 05/31/23 1100             Core Components/Risk Factors/Patient Goals Review   Personal Goals Review Weight Management/Obesity;Hypertension;Lipids;Stress       Review Tsutomu started cardiac rehab on 05/28/23. Vaden did well with exercise. Vital signs were stable       Expected Outcomes Finneus will continue to participate in cardiac rehab for exercise, nutrtion and lifestyle modifications  Core Components/Risk Factors/Patient Goals at Discharge (Final Review):   Goals and Risk Factor Review - 05/31/23 1100       Core Components/Risk Factors/Patient Goals Review   Personal Goals Review Weight Management/Obesity;Hypertension;Lipids;Stress    Review Brenon started cardiac rehab on 05/28/23. Jeffie did well with exercise. Vital signs were stable    Expected Outcomes Dodger will continue to participate in cardiac rehab for exercise, nutrtion and lifestyle modifications             ITP Comments:  ITP Comments     Row Name 05/24/23 0756 05/31/23 1054         ITP Comments Wilbert Holland, MD: Medical Director.  Intorduction to the [employer]  Initial orientation packet reviewed with the patient. 30 Day ITP Review. Renner started cardiac rehab on 05/28/23. Fotios did well on his first day and is off to a good start to exercise.               Comments: See ITP comment.

## 2023-06-06 ENCOUNTER — Encounter (HOSPITAL_COMMUNITY)
Admission: RE | Admit: 2023-06-06 | Discharge: 2023-06-06 | Disposition: A | Discharge status: Home / Self Care | Payer: 59 | Source: Ambulatory Visit | Attending: Cardiovascular Disease | Admitting: Cardiovascular Disease

## 2023-06-06 DIAGNOSIS — I2102 ST elevation (STEMI) myocardial infarction involving left anterior descending coronary artery: Secondary | ICD-10-CM

## 2023-06-06 DIAGNOSIS — Z955 Presence of coronary angioplasty implant and graft: Secondary | ICD-10-CM | POA: Diagnosis not present

## 2023-06-07 ENCOUNTER — Telehealth (HOSPITAL_COMMUNITY): Payer: Self-pay | Admitting: *Deleted

## 2023-06-07 NOTE — Telephone Encounter (Signed)
 received message to cancel upcoming cardiac rehab appointments for this Friday (1/10), Monday (1/13) and Wednesday (1/15). Will Return next Friday.

## 2023-06-08 ENCOUNTER — Encounter (HOSPITAL_COMMUNITY): Payer: 59

## 2023-06-11 ENCOUNTER — Encounter (HOSPITAL_COMMUNITY): Payer: 59

## 2023-06-13 ENCOUNTER — Encounter (HOSPITAL_COMMUNITY): Payer: 59

## 2023-06-15 ENCOUNTER — Encounter (HOSPITAL_COMMUNITY)
Admission: RE | Admit: 2023-06-15 | Discharge: 2023-06-15 | Disposition: A | Discharge status: Home / Self Care | Payer: 59 | Source: Ambulatory Visit | Attending: Cardiovascular Disease

## 2023-06-15 DIAGNOSIS — Z955 Presence of coronary angioplasty implant and graft: Secondary | ICD-10-CM | POA: Diagnosis not present

## 2023-06-15 DIAGNOSIS — I2102 ST elevation (STEMI) myocardial infarction involving left anterior descending coronary artery: Secondary | ICD-10-CM

## 2023-06-18 ENCOUNTER — Encounter (HOSPITAL_COMMUNITY)
Admission: RE | Admit: 2023-06-18 | Discharge: 2023-06-18 | Disposition: A | Discharge status: Home / Self Care | Payer: 59 | Source: Ambulatory Visit | Attending: Cardiovascular Disease | Admitting: Cardiovascular Disease

## 2023-06-18 DIAGNOSIS — Z955 Presence of coronary angioplasty implant and graft: Secondary | ICD-10-CM | POA: Diagnosis not present

## 2023-06-18 DIAGNOSIS — I2102 ST elevation (STEMI) myocardial infarction involving left anterior descending coronary artery: Secondary | ICD-10-CM

## 2023-06-20 ENCOUNTER — Encounter (HOSPITAL_COMMUNITY)
Admission: RE | Admit: 2023-06-20 | Discharge: 2023-06-20 | Disposition: A | Discharge status: Home / Self Care | Payer: 59 | Source: Ambulatory Visit | Attending: Cardiovascular Disease | Admitting: Cardiovascular Disease

## 2023-06-20 DIAGNOSIS — I2102 ST elevation (STEMI) myocardial infarction involving left anterior descending coronary artery: Secondary | ICD-10-CM

## 2023-06-20 DIAGNOSIS — Z955 Presence of coronary angioplasty implant and graft: Secondary | ICD-10-CM | POA: Diagnosis not present

## 2023-06-22 ENCOUNTER — Encounter (HOSPITAL_COMMUNITY)
Admission: RE | Admit: 2023-06-22 | Discharge: 2023-06-22 | Disposition: A | Discharge status: Home / Self Care | Payer: 59 | Source: Ambulatory Visit | Attending: Cardiovascular Disease | Admitting: Cardiovascular Disease

## 2023-06-22 ENCOUNTER — Encounter (HOSPITAL_COMMUNITY): Payer: 59

## 2023-06-22 DIAGNOSIS — Z955 Presence of coronary angioplasty implant and graft: Secondary | ICD-10-CM | POA: Diagnosis not present

## 2023-06-22 DIAGNOSIS — I2102 ST elevation (STEMI) myocardial infarction involving left anterior descending coronary artery: Secondary | ICD-10-CM

## 2023-06-25 ENCOUNTER — Encounter (HOSPITAL_COMMUNITY)
Admission: RE | Admit: 2023-06-25 | Discharge: 2023-06-25 | Disposition: A | Discharge status: Home / Self Care | Payer: 59 | Source: Ambulatory Visit | Attending: Cardiovascular Disease | Admitting: Cardiovascular Disease

## 2023-06-25 DIAGNOSIS — Z955 Presence of coronary angioplasty implant and graft: Secondary | ICD-10-CM

## 2023-06-25 DIAGNOSIS — I2102 ST elevation (STEMI) myocardial infarction involving left anterior descending coronary artery: Secondary | ICD-10-CM

## 2023-06-27 ENCOUNTER — Encounter (HOSPITAL_COMMUNITY)
Admission: RE | Admit: 2023-06-27 | Discharge: 2023-06-27 | Disposition: A | Discharge status: Home / Self Care | Payer: 59 | Source: Ambulatory Visit | Attending: Cardiovascular Disease | Admitting: Cardiovascular Disease

## 2023-06-27 DIAGNOSIS — Z955 Presence of coronary angioplasty implant and graft: Secondary | ICD-10-CM | POA: Diagnosis not present

## 2023-06-27 DIAGNOSIS — I2102 ST elevation (STEMI) myocardial infarction involving left anterior descending coronary artery: Secondary | ICD-10-CM

## 2023-06-27 NOTE — Progress Notes (Signed)
Cardiac Individual Treatment Plan  Patient Details  Name: Rhea Thrun MRN: [id number] Date of Birth: [birth year] Referring Provider:   Flowsheet Row INTENSIVE CARDIAC REHAB ORIENT from 05/24/2023 in [facility]  Referring Provider Dr. Tonny Bollman MD       Initial Encounter Date:  Flowsheet Row INTENSIVE CARDIAC REHAB ORIENT from 05/24/2023 in [facility]  Date 05/24/23       Visit Diagnosis: 04/28/23 ST elevation myocardial infarction involving left anterior descending (LAD) coronary artery (HCC)  04/28/23 Status post coronary artery stent placement LAD  Patient's Home Medications on Admission:  Current Outpatient Medications:    amLODipine (NORVASC) 5 MG tablet, Take 1 tablet (5 mg total) by mouth daily., Disp: 30 tablet, Rfl: 3   aspirin EC 81 MG tablet, Take 1 tablet (81 mg total) by mouth daily. Swallow whole., Disp: 90 tablet, Rfl: 3   atorvastatin (LIPITOR) 80 MG tablet, Take 1 tablet (80 mg total) by mouth daily., Disp: 90 tablet, Rfl: 3   escitalopram (LEXAPRO) 20 MG tablet, Take 20 mg by mouth daily., Disp: , Rfl:    nitroGLYCERIN (NITROSTAT) 0.4 MG SL tablet, Place 1 tablet (0.4 mg total) under the tongue every 5 (five) minutes x 3 doses as needed for chest pain., Disp: 25 tablet, Rfl: 0   prasugrel (EFFIENT) 10 MG TABS tablet, Take 1 tablet (10 mg total) by mouth daily. To be filled on 05/30/2023., Disp: 30 tablet, Rfl: 0  Past Medical History: Past Medical History:  Diagnosis Date   History of kidney stones    Sleep apnea    Pt states he was told after last surgery that he needed to be tested for sleep apnea. stated he "stopped breathing and had to be bagged" Pt has not had follow up testing    Tobacco Use: Social History   Tobacco Use  Smoking Status Never  Smokeless Tobacco Never    Labs: Review Flowsheet       Latest Ref Rng & Units  04/28/2023  Labs for ITP Cardiac and Pulmonary Rehab  Cholestrol 0 - 200 mg/dL 409  811   LDL (calc) 0 - 99 mg/dL 914  782   HDL-C >95 mg/dL 41  45   Trlycerides <621 mg/dL 308  657   Hemoglobin Q4O 4.8 - 5.6 % 5.3  5.2   TCO2 22 - 32 mmol/L 25     Details       Multiple values from one day are sorted in reverse-chronological order         Capillary Blood Glucose: No results found for: "GLUCAP"   Exercise Target Goals: Exercise Program Goal: Individual exercise prescription set using results from initial 6 min walk test and THRR while considering  patient's activity barriers and safety.   Exercise Prescription Goal: Initial exercise prescription builds to 30-45 minutes a day of aerobic activity, 2-3 days per week.  Home exercise guidelines will be given to patient during program as part of exercise prescription that the participant will acknowledge.  Activity Barriers & Risk Stratification:  Activity Barriers & Cardiac Risk Stratification - 05/24/23 0939       Activity Barriers & Cardiac Risk Stratification   Activity Barriers None    Cardiac Risk Stratification High   under 5 MET's on pre            6 Minute Walk:  6 Minute Walk  Row Name 05/24/23 1014         6 Minute Walk   Phase Initial     Distance 1370 feet     Walk Time 6 minutes     # of Rest Breaks 0     MPH 2.59     METS 3.52     RPE 11     Perceived Dyspnea  0     VO2 Peak 12.3     Symptoms No     Resting HR 76 bpm     Resting BP 118/78     Resting Oxygen Saturation  98 %     Exercise Oxygen Saturation  during 6 min walk 98 %     Max Ex. HR 91 bpm     Max Ex. BP 134/80     2 Minute Post BP 120/78              Oxygen Initial Assessment:   Oxygen Re-Evaluation:   Oxygen Discharge (Final Oxygen Re-Evaluation):   Initial Exercise Prescription:  Initial Exercise Prescription - 05/24/23 1000       Date of Initial Exercise RX and Referring Provider   Date 05/24/23     Referring Provider Dr. Tonny Bollman MD    Expected Discharge Date 08/15/23      NuStep   Level 2    SPM 80    Minutes 15    METs 2      Recumbant Elliptical   Level 1    RPM 40    Watts 60    Minutes 15    METs 2      Prescription Details   Frequency (times per week) 3    Duration Progress to 30 minutes of continuous aerobic without signs/symptoms of physical distress      Intensity   THRR 40-80% of Max Heartrate 68-136    Ratings of Perceived Exertion 11-13    Perceived Dyspnea 0-4      Progression   Progression Continue progressive overload as per policy without signs/symptoms or physical distress.      Resistance Training   Training Prescription Yes    Weight 3    Reps 10-15             Perform Capillary Blood Glucose checks as needed.  Exercise Prescription Changes:   Exercise Prescription Changes     Row Name 05/28/23 0800 06/15/23 0821           Response to Exercise   Blood Pressure (Admit) 114/74 122/74      Blood Pressure (Exercise) 124/70 136/66      Blood Pressure (Exit) 102/66 122/72      Heart Rate (Admit) 74 bpm 76 bpm      Heart Rate (Exercise) 112 bpm 113 bpm      Heart Rate (Exit) 75 bpm 85 bpm      Rating of Perceived Exertion (Exercise) 11 9.5      Perceived Dyspnea (Exercise) -- 0      Symptoms None None      Comments Pt first day Reviewed MET's and goals      Duration Continue with 30 min of aerobic exercise without signs/symptoms of physical distress. Continue with 30 min of aerobic exercise without signs/symptoms of physical distress.      Intensity THRR unchanged THRR unchanged        Progression   Progression Continue to progress workloads to maintain intensity without signs/symptoms of physical distress. Continue to progress  workloads to maintain intensity without signs/symptoms of physical distress.      Average METs 2.8 3.3        Resistance Training   Training Prescription Yes Yes      Weight 3 4      Reps 10-15  10-15      Time -- 10 Minutes        NuStep   Level 2 3      SPM 95 99      Minutes 15 15      METs 2.2 2.7        Recumbant Elliptical   Level 1 2      RPM 56 76      Watts 99 105      Minutes 15 15      METs 3.3 3.9               Exercise Comments:   Exercise Comments     Row Name 05/28/23 0839 06/15/23 0827         Exercise Comments Pt first day in program, pt tolerated exercise without unusual signs or symptoms. Will continue to watch and increase workload as tolerated. Reviewed MET's and goals. Pt averaged 3.3 Mets and tolerated exercise well. Pt feels good about his goals and has already made some food changes and is increasing strength, continues to work on flexibility training, but is starting to add some in on his own at home. Will review home ExRx soon. Just got back from vacation, will work on routine in [employer] and add more as ready               Exercise Goals and Review:   Exercise Goals     Row Name 05/24/23 0758             Exercise Goals   Increase Physical Activity Yes       Intervention Provide advice, education, support and counseling about physical activity/exercise needs.;Develop an individualized exercise prescription for aerobic and resistive training based on initial evaluation findings, risk stratification, comorbidities and participant's personal goals.       Expected Outcomes Short Term: Attend rehab on a regular basis to increase amount of physical activity.;Long Term: Add in home exercise to make exercise part of routine and to increase amount of physical activity.;Long Term: Exercising regularly at least 3-5 days a week.       Increase Strength and Stamina Yes       Intervention Provide advice, education, support and counseling about physical activity/exercise needs.;Develop an individualized exercise prescription for aerobic and resistive training based on initial evaluation findings, risk stratification, comorbidities and participant's  personal goals.       Expected Outcomes Short Term: Increase workloads from initial exercise prescription for resistance, speed, and METs.;Short Term: Perform resistance training exercises routinely during rehab and add in resistance training at home;Long Term: Improve cardiorespiratory fitness, muscular endurance and strength as measured by increased METs and functional capacity ( )       Able to understand and use rate of perceived exertion (RPE) scale Yes       Intervention Provide education and explanation on how to use RPE scale       Expected Outcomes Short Term: Able to use RPE daily in rehab to express subjective intensity level;Long Term:  Able to use RPE to guide intensity level when exercising independently       Knowledge and understanding of Target Heart Rate Range (THRR) Yes  Intervention Provide education and explanation of THRR including how the numbers were predicted and where they are located for reference       Expected Outcomes Short Term: Able to state/look up THRR;Long Term: Able to use THRR to govern intensity when exercising independently;Short Term: Able to use daily as guideline for intensity in rehab       Understanding of Exercise Prescription Yes       Intervention Provide education, explanation, and written materials on patient's individual exercise prescription       Expected Outcomes Short Term: Able to explain program exercise prescription;Long Term: Able to explain home exercise prescription to exercise independently                Exercise Goals Re-Evaluation :  Exercise Goals Re-Evaluation     Row Name 05/28/23 0837 06/15/23 0824           Exercise Goal Re-Evaluation   Exercise Goals Review Increase Physical Activity;Understanding of Exercise Prescription;Increase Strength and Stamina;Knowledge and understanding of Target Heart Rate Range (THRR);Able to understand and use rate of perceived exertion (RPE) scale Increase Physical  Activity;Understanding of Exercise Prescription;Increase Strength and Stamina;Knowledge and understanding of Target Heart Rate Range (THRR);Able to understand and use rate of perceived exertion (RPE) scale      Comments Pt first day in program, pt tolerated exercise without unusual signs or symptoms. Pt averaged 2.8 Mets first day, will continue to monitor and increase workload as tolerated. Reviewed MET's and goals. Pt averaged 3.3 Mets and tolerated exercise well. Pt feels good about his goals and has already made some food changes and is increasing strength, continues to work on flexibility training, but is starting to add some in on his own at home. Will review home ExRx soon. Just got back from vacation, will work on routine in [employer] and add more as ready      Expected Outcomes Will continue to monitor and increase workload as tolerared. Will continue to monitor and increase workload as tolerared.               Discharge Exercise Prescription (Final Exercise Prescription Changes):  Exercise Prescription Changes - 06/15/23 0821       Response to Exercise   Blood Pressure (Admit) 122/74    Blood Pressure (Exercise) 136/66    Blood Pressure (Exit) 122/72    Heart Rate (Admit) 76 bpm    Heart Rate (Exercise) 113 bpm    Heart Rate (Exit) 85 bpm    Rating of Perceived Exertion (Exercise) 9.5    Perceived Dyspnea (Exercise) 0    Symptoms None    Comments Reviewed MET's and goals    Duration Continue with 30 min of aerobic exercise without signs/symptoms of physical distress.    Intensity THRR unchanged      Progression   Progression Continue to progress workloads to maintain intensity without signs/symptoms of physical distress.    Average METs 3.3      Resistance Training   Training Prescription Yes    Weight 4    Reps 10-15    Time 10 Minutes      NuStep   Level 3    SPM 99    Minutes 15    METs 2.7      Recumbant Elliptical   Level 2    RPM 76    Watts 105    Minutes 15     METs 3.9  Nutrition:  Target Goals: Understanding of nutrition guidelines, daily intake of sodium 1500mg , cholesterol 200mg , calories 30% from fat and 7% or less from saturated fats, daily to have 5 or more servings of fruits and vegetables.  Biometrics:  Pre Biometrics - 05/24/23 0917       Pre Biometrics   Waist Circumference 43 inches    Hip Circumference 45 inches    Waist to Hip Ratio 0.96 %    Triceps Skinfold 12 mm    % Body Fat 31 %    Grip Strength 55 kg    Flexibility 4 in    Single Leg Stand 30 seconds              Nutrition Therapy Plan and Nutrition Goals:   Nutrition Assessments:  Nutrition Assessments - 05/24/23 0956       Rate Your Plate Scores   Pre Score 57            MEDIFICTS Score Key: >=70 Need to make dietary changes  40-70 Heart Healthy Diet <= 40 Therapeutic Level Cholesterol Diet   Flowsheet Row INTENSIVE CARDIAC REHAB ORIENT from [facility]/24/2023 in [facility]  Picture Your Plate Total Score on Admission 57      Picture Your Plate Scores: <16 Unhealthy dietary pattern with much room for improvement. 41-50 Dietary pattern unlikely to meet recommendations for good health and room for improvement. 51-60 More healthful dietary pattern, with some room for improvement.  >60 Healthy dietary pattern, although there may be some specific behaviors that could be improved.    Nutrition Goals Re-Evaluation:   Nutrition Goals Re-Evaluation:   Nutrition Goals Discharge (Final Nutrition Goals Re-Evaluation):   Psychosocial: Target Goals: Acknowledge presence or absence of significant depression and/or stress, maximize coping skills, provide positive support system. Participant is able to verbalize types and ability to use techniques and skills needed for reducing stress and depression.  Initial Review & Psychosocial Screening:  Initial Psych Review & Screening  - 05/24/23 0940       Initial Review   Current issues with Current Anxiety/Panic;Current Stress Concerns;Current Sleep Concerns    Source of Stress Concerns Family    Comments Pt feels so anxiety and stress due to work and family, But has a good support system with his family and work friends. Pt has no additional needs at this time.      Family Dynamics   Good Support System? Yes      Screening Interventions   Interventions Encouraged to exercise;Provide feedback about the scores to participant    Expected Outcomes Long Term Goal: Stressors or current issues are controlled or eliminated.;Short Term goal: Identification and review with participant of any Quality of Life or Depression concerns found by scoring the questionnaire.;Long Term goal: The participant improves quality of Life and PHQ9 Scores as seen by post scores and/or verbalization of changes             Quality of Life Scores:  Quality of Life - 05/24/23 0947       Quality of Life   Select Quality of Life      Quality of Life Scores   Health/Function Pre 22.6 %    Socioeconomic Pre 23.97 %    Psych/Spiritual Pre 25.2 %    Family Pre 25.2 %    GLOBAL Pre 22.71 %            Scores of 19 and below usually indicate  a poorer quality of life in these areas.  A difference of  2-3 points is a clinically meaningful difference.  A difference of 2-3 points in the total score of the Quality of Life Index has been associated with significant improvement in overall quality of life, self-image, physical symptoms, and general health in studies assessing change in quality of life.  PHQ-9: Review Flowsheet       05/24/2023  Depression screen PHQ 2/9  Decreased Interest 2  Down, Depressed, Hopeless 0  PHQ - 2 Score 2  Altered sleeping 1  Tired, decreased energy 2  Change in appetite 0  Feeling bad or failure about yourself  0  Trouble concentrating 2  Moving slowly or fidgety/restless 0  Suicidal thoughts 0  PHQ-9  Score 7  Difficult doing work/chores Not difficult at all   Interpretation of Total Score  Total Score Depression Severity:  1-4 = Minimal depression, 5-9 = Mild depression, 10-14 = Moderate depression, 15-19 = Moderately severe depression, 20-27 = Severe depression   Psychosocial Evaluation and Intervention:   Psychosocial Re-Evaluation:  Psychosocial Re-Evaluation     Row Name 05/31/23 1056 06/27/23 4098           Psychosocial Re-Evaluation   Current issues with Current Anxiety/Panic;Current Sleep Concerns;Current Stress Concerns None Identified;Current Stress Concerns      Comments PHQ2-9 review by Girtha Hake RN M[facility] on 05/28/23.PHQ9=7, reviewed with patient who reports doing much better since procedure and sleeping better. Has a supportive family. Shared work has been main source contributing to the score and is trying to do better managing work related stressors. Patient denies depression and does not have the need for follow-up counseling. Pt exhibits positive coping skills, hopeful outlook with supportive family. No psychosocial needs identified at this time, Pt exhibits positive coping skills, hopeful outlook with supportive family. No psychosocial needs identified at this time,      Expected Outcomes Purl will have decreased on controlled anxiety/stressors upon completion of cardiac rehab. Nakul will have decreased on controlled anxiety/stressors upon completion of cardiac rehab.      Interventions Stress management education;Encouraged to attend Cardiac Rehabilitation for the exercise;Relaxation education Stress management education;Encouraged to attend Cardiac Rehabilitation for the exercise;Relaxation education      Continue Psychosocial Services  No Follow up required No Follow up required        Initial Review   Source of Stress Concerns Chronic Illness;Family;Occupation Chronic Illness;Family;Occupation      Comments Will continue to [employer] and offer support as needed. Will  continue to montior and offer support as needed.               Psychosocial Discharge (Final Psychosocial Re-Evaluation):  Psychosocial Re-Evaluation - 06/27/23 1191       Psychosocial Re-Evaluation   Current issues with None Identified;Current Stress Concerns    Comments Pt exhibits positive coping skills, hopeful outlook with supportive family. No psychosocial needs identified at this time,    Expected Outcomes Anis will have decreased on controlled anxiety/stressors upon completion of cardiac rehab.    Interventions Stress management education;Encouraged to attend Cardiac Rehabilitation for the exercise;Relaxation education    Continue Psychosocial Services  No Follow up required      Initial Review   Source of Stress Concerns Chronic Illness;Family;Occupation    Comments Will continue to [employer] and offer support as needed.             Vocational Rehabilitation: Provide vocational rehab assistance to qualifying candidates.  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            Education: Education Goals: Education classes will be provided on a weekly basis, covering required topics. Participant will state understanding/return demonstration of topics presented.    Education     Row Name 05/28/23 0900     Education   Cardiac Education Topics Pritikin   Select Workshops     Workshops   Educator Exercise Physiologist   Select Psychosocial   Psychosocial Workshop Focused Goals, Sustainable Changes   Instruction Review Code 1- Verbalizes Understanding   Class Start Time 0815   Class Stop Time 0848   Class Time Calculation (min) 33 min    Row Name 06/06/23 1100     Education   Cardiac Education Topics Pritikin   Secondary school teacher School    Educator Nurse;Respiratory Therapist   Weekly Topic Comforting Weekend Breakfasts   Instruction Review Code 1- Verbalizes Understanding   Class Start Time 0818   Class Stop Time 0858   Class Time Calculation (min) 40 min    Row Name 06/15/23 0900     Education   Cardiac Education Topics Pritikin   Glass blower/designer Nutrition   Nutrition Workshop Fueling a Forensic psychologist   Instruction Review Code 1- Verbalizes Understanding   Class Start Time 0815   Class Stop Time 0850   Class Time Calculation (min) 35 min    Row Name 06/18/23 1300     Education   Cardiac Education Topics Pritikin   Select Workshops     Workshops   Educator Exercise Physiologist   Select Psychosocial   Psychosocial Workshop Healthy Sleep for a Healthy Heart   Instruction Review Code 1- Verbalizes Understanding   Class Start Time [phone]   Class Stop Time 0857   Class Time Calculation (min) 43 min    Row Name 06/22/23 1600     Education   Cardiac Education Topics Pritikin   Nurse, children's Exercise Physiologist   Select Psychosocial   Psychosocial How Our Thoughts Can Heal Our Hearts   Instruction Review Code 1- Verbalizes Understanding   Class Start Time 1358   Class Stop Time 1435   Class Time Calculation (min) 37 min            Core Videos: Exercise    Move It!  Clinical staff conducted group or individual video education with verbal and written material and guidebook.  Patient learns the recommended Pritikin exercise program. Exercise with the goal of living a long, healthy life. Some of the health benefits of exercise include controlled diabetes, healthier blood pressure levels, improved cholesterol levels, improved heart and lung capacity, improved sleep, and better body composition. Everyone should speak with their doctor before starting or changing an exercise routine.  Biomechanical Limitations Clinical staff  conducted group or individual video education with verbal and written material and guidebook.  Patient learns how biomechanical limitations can impact exercise and how we can mitigate and possibly overcome limitations to have an impactful and balanced exercise routine.  Body Composition Clinical staff conducted group or individual video education with verbal and written material  and guidebook.  Patient learns that body composition (ratio of muscle mass to fat mass) is a key component to assessing overall fitness, rather than body weight alone. Increased fat mass, especially visceral belly fat, can put [place] at increased risk for metabolic syndrome, type 2 diabetes, heart disease, and even death. It is recommended to combine diet and exercise (cardiovascular and resistance training) to improve your body composition. Seek guidance from your physician and exercise physiologist before implementing an exercise routine.  Exercise Action Plan Clinical staff conducted group or individual video education with verbal and written material and guidebook.  Patient learns the recommended strategies to achieve and enjoy long-term exercise adherence, including variety, self-motivation, self-efficacy, and positive decision making. Benefits of exercise include fitness, good health, weight management, more energy, better sleep, less stress, and overall well-being.  Medical   Heart Disease Risk Reduction Clinical staff conducted group or individual video education with verbal and written material and guidebook.  Patient learns our heart is our most vital organ as it circulates oxygen, nutrients, white blood cells, and hormones throughout the entire body, and carries waste away. Data supports a plant-based eating plan like the Pritikin Program for its effectiveness in slowing progression of and reversing heart disease. The video provides a number of recommendations to address heart disease.   Metabolic Syndrome and Belly  Fat  Clinical staff conducted group or individual video education with verbal and written material and guidebook.  Patient learns what metabolic syndrome is, how it leads to heart disease, and how one can reverse it and keep it from coming back. You have metabolic syndrome if you have 3 of the following 5 criteria: abdominal obesity, high blood pressure, high triglycerides, low HDL cholesterol, and high blood sugar.  Hypertension and Heart Disease Clinical staff conducted group or individual video education with verbal and written material and guidebook.  Patient learns that high blood pressure, or hypertension, is very common in the [place]. Hypertension is largely due to excessive salt intake, but other important risk factors include being overweight, physical inactivity, drinking too much alcohol, smoking, and not eating enough potassium from fruits and vegetables. High blood pressure is a leading risk factor for heart attack, stroke, congestive heart failure, dementia, kidney failure, and premature death. Long-term effects of excessive salt intake include stiffening of the arteries and thickening of heart muscle and organ damage. Recommendations include ways to reduce hypertension and the risk of heart disease.  Diseases of Our Time - Focusing on Diabetes Clinical staff conducted group or individual video education with verbal and written material and guidebook.  Patient learns why the best way to stop diseases of our time is prevention, through food and other lifestyle changes. Medicine (such as prescription pills and surgeries) is often only a Band-Aid on the problem, not a long-term solution. Most common diseases of our time include obesity, type 2 diabetes, hypertension, heart disease, and cancer. The Pritikin Program is recommended and has been proven to help reduce, reverse, and/or prevent the damaging effects of metabolic syndrome.  Nutrition   Overview of the Pritikin Eating Plan   Clinical staff conducted group or individual video education with verbal and written material and guidebook.  Patient learns about the Pritikin Eating Plan for disease risk reduction. The Pritikin Eating Plan emphasizes a wide variety of unrefined, minimally-processed carbohydrates, like fruits, vegetables, whole grains, and legumes. Go, Caution, and Stop food choices are explained. Plant-based and lean animal proteins are emphasized. Rationale provided for low sodium intake for  blood pressure control, low added sugars for blood sugar stabilization, and low added fats and oils for coronary artery disease risk reduction and weight management.  Calorie Density  Clinical staff conducted group or individual video education with verbal and written material and guidebook.  Patient learns about calorie density and how it impacts the Pritikin Eating Plan. Knowing the characteristics of the food you choose will help you decide whether those foods will lead to weight gain or weight loss, and whether you want to consume more or less of them. Weight loss is usually a side effect of the Pritikin Eating Plan because of its focus on low calorie-dense foods.  Label Reading  Clinical staff conducted group or individual video education with verbal and written material and guidebook.  Patient learns about the Pritikin recommended label reading guidelines and corresponding recommendations regarding calorie density, added sugars, sodium content, and whole grains.  Dining Out - Part 1  Clinical staff conducted group or individual video education with verbal and written material and guidebook.  Patient learns that restaurant meals can be sabotaging because they can be so high in calories, fat, sodium, and/or sugar. Patient learns recommended strategies on how to positively address this and avoid unhealthy pitfalls.  Facts on Fats  Clinical staff conducted group or individual video education with verbal and written  material and guidebook.  Patient learns that lifestyle modifications can be just as effective, if not more so, as many medications for lowering your risk of heart disease. A Pritikin lifestyle can help to reduce your risk of inflammation and atherosclerosis (cholesterol build-up, or plaque, in the artery walls). Lifestyle interventions such as dietary choices and physical activity address the cause of atherosclerosis. A review of the types of fats and their impact on blood cholesterol levels, along with dietary recommendations to reduce fat intake is also included.  Nutrition Action Plan  Clinical staff conducted group or individual video education with verbal and written material and guidebook.  Patient learns how to incorporate Pritikin recommendations into their lifestyle. Recommendations include planning and keeping personal health goals in mind as an important part of their success.  Healthy Mind-Set    Healthy Minds, Bodies, Hearts  Clinical staff conducted group or individual video education with verbal and written material and guidebook.  Patient learns how to identify when they are stressed. Video will discuss the impact of that stress, as well as the many benefits of stress management. Patient will also be introduced to stress management techniques. The way we think, act, and feel has an impact on our hearts.  How Our Thoughts Can Heal Our Hearts  Clinical staff conducted group or individual video education with verbal and written material and guidebook.  Patient learns that negative thoughts can cause depression and anxiety. This can result in negative lifestyle behavior and serious health problems. Cognitive behavioral therapy is an effective method to help control our thoughts in order to change and improve our emotional outlook.  Additional Videos:  Exercise    Improving Performance  Clinical staff conducted group or individual video education with verbal and written material and  guidebook.  Patient learns to use a non-linear approach by alternating intensity levels and lengths of time spent exercising to help burn more calories and lose more body fat. Cardiovascular exercise helps improve heart health, metabolism, hormonal balance, blood sugar control, and recovery from fatigue. Resistance training improves strength, endurance, balance, coordination, reaction time, metabolism, and muscle mass. Flexibility exercise improves circulation, posture, and balance. Seek guidance  from your physician and exercise physiologist before implementing an exercise routine and learn your capabilities and proper form for all exercise.  Introduction to Yoga  Clinical staff conducted group or individual video education with verbal and written material and guidebook.  Patient learns about yoga, a discipline of the coming together of mind, breath, and body. The benefits of yoga include improved flexibility, improved range of motion, better posture and core strength, increased lung function, weight loss, and positive self-image. Yoga's heart health benefits include lowered blood pressure, healthier heart rate, decreased cholesterol and triglyceride levels, improved immune function, and reduced stress. Seek guidance from your physician and exercise physiologist before implementing an exercise routine and learn your capabilities and proper form for all exercise.  Medical   Aging: Enhancing Your Quality of Life  Clinical staff conducted group or individual video education with verbal and written material and guidebook.  Patient learns key strategies and recommendations to stay in good physical health and enhance quality of life, such as prevention strategies, having an advocate, securing a Health Care Proxy and Power of Attorney, and keeping a list of medications and system for tracking them. It also discusses how to avoid risk for bone loss.  Biology of Weight Control  Clinical staff conducted group or  individual video education with verbal and written material and guidebook.  Patient learns that weight gain occurs because we consume more calories than we burn (eating more, moving less). Even if your body weight is normal, you may have higher ratios of fat compared to muscle mass. Too much body fat puts you at increased risk for cardiovascular disease, heart attack, stroke, type 2 diabetes, and obesity-related cancers. In addition to exercise, following the Pritikin Eating Plan can help reduce your risk.  Decoding Lab Results  Clinical staff conducted group or individual video education with verbal and written material and guidebook.  Patient learns that lab test reflects one measurement whose values change over time and are influenced by many factors, including medication, stress, sleep, exercise, food, hydration, pre-existing medical conditions, and more. It is recommended to use the knowledge from this video to become more involved with your lab results and evaluate your numbers to speak with your doctor.   Diseases of Our Time - Overview  Clinical staff conducted group or individual video education with verbal and written material and guidebook.  Patient learns that according to the CDC, 50% to 70% of chronic diseases (such as obesity, type 2 diabetes, elevated lipids, hypertension, and heart disease) are avoidable through lifestyle improvements including healthier food choices, listening to satiety cues, and increased physical activity.  Sleep Disorders Clinical staff conducted group or individual video education with verbal and written material and guidebook.  Patient learns how good quality and duration of sleep are important to overall health and well-being. Patient also learns about sleep disorders and how they impact health along with recommendations to address them, including discussing with a physician.  Nutrition  Dining Out - Part 2 Clinical staff conducted group or individual video  education with verbal and written material and guidebook.  Patient learns how to plan ahead and communicate in order to maximize their dining experience in a healthy and nutritious manner. Included are recommended food choices based on the type of restaurant the patient is visiting.   Fueling a Banker conducted group or individual video education with verbal and written material and guidebook.  There is a strong connection between our food choices and our health.  Diseases like obesity and type 2 diabetes are very prevalent and are in large-part due to lifestyle choices. The Pritikin Eating Plan provides plenty of food and hunger-curbing satisfaction. It is easy to follow, affordable, and helps reduce health risks.  Menu Workshop  Clinical staff conducted group or individual video education with verbal and written material and guidebook.  Patient learns that restaurant meals can sabotage health goals because they are often packed with calories, fat, sodium, and sugar. Recommendations include strategies to plan ahead and to communicate with the manager, chef, or server to help order a healthier meal.  Planning Your Eating Strategy  Clinical staff conducted group or individual video education with verbal and written material and guidebook.  Patient learns about the Pritikin Eating Plan and its benefit of reducing the risk of disease. The Pritikin Eating Plan does not focus on calories. Instead, it emphasizes high-quality, nutrient-rich foods. By knowing the characteristics of the foods, we choose, we can determine their calorie density and make informed decisions.  Targeting Your Nutrition Priorities  Clinical staff conducted group or individual video education with verbal and written material and guidebook.  Patient learns that lifestyle habits have a tremendous impact on disease risk and progression. This video provides eating and physical activity recommendations based on your  personal health goals, such as reducing LDL cholesterol, losing weight, preventing or controlling type 2 diabetes, and reducing high blood pressure.  Vitamins and Minerals  Clinical staff conducted group or individual video education with verbal and written material and guidebook.  Patient learns different ways to obtain key vitamins and minerals, including through a recommended healthy diet. It is important to discuss all supplements you take with your doctor.   Healthy Mind-Set    Smoking Cessation  Clinical staff conducted group or individual video education with verbal and written material and guidebook.  Patient learns that cigarette smoking and tobacco addiction pose a serious health risk which affects millions of people. Stopping smoking will significantly reduce the risk of heart disease, lung disease, and many forms of cancer. Recommended strategies for quitting are covered, including working with your doctor to develop a successful plan.  Culinary   Becoming a Set designer conducted group or individual video education with verbal and written material and guidebook.  Patient learns that cooking at home can be healthy, cost-effective, quick, and puts them in control. Keys to cooking healthy recipes will include looking at your recipe, assessing your equipment needs, planning ahead, making it simple, choosing cost-effective seasonal ingredients, and limiting the use of added fats, salts, and sugars.  Cooking - Breakfast and Snacks  Clinical staff conducted group or individual video education with verbal and written material and guidebook.  Patient learns how important breakfast is to satiety and nutrition through the entire day. Recommendations include key foods to eat during breakfast to help stabilize blood sugar levels and to prevent overeating at meals later in the day. Planning ahead is also a key component.  Cooking - Educational psychologist conducted  group or individual video education with verbal and written material and guidebook.  Patient learns eating strategies to improve overall health, including an approach to cook more at home. Recommendations include thinking of animal protein as a side on your plate rather than center stage and focusing instead on lower calorie dense options like vegetables, fruits, whole grains, and plant-based proteins, such as beans. Making sauces in large quantities to freeze for later and leaving the skin on  your vegetables are also recommended to maximize your experience.  Cooking - Healthy Salads and Dressing Clinical staff conducted group or individual video education with verbal and written material and guidebook.  Patient learns that vegetables, fruits, whole grains, and legumes are the foundations of the Pritikin Eating Plan. Recommendations include how to incorporate each of these in flavorful and healthy salads, and how to create homemade salad dressings. Proper handling of ingredients is also covered. Cooking - Soups and [employer] - Soups and Desserts Clinical staff conducted group or individual video education with verbal and written material and guidebook.  Patient learns that Pritikin soups and desserts make for easy, nutritious, and delicious snacks and meal components that are low in sodium, fat, sugar, and calorie density, while high in vitamins, minerals, and filling fiber. Recommendations include simple and healthy ideas for soups and desserts.   Overview     The Pritikin Solution Program Overview Clinical staff conducted group or individual video education with verbal and written material and guidebook.  Patient learns that the results of the Pritikin Program have been documented in more than 100 articles published in peer-reviewed journals, and the benefits include reducing risk factors for (and, in some cases, even reversing) high cholesterol, high blood pressure, type 2 diabetes, obesity,  and more! An overview of the three key pillars of the Pritikin Program will be covered: eating well, doing regular exercise, and having a healthy mind-set.  WORKSHOPS  Exercise: Exercise Basics: Building Your Action Plan Clinical staff led group instruction and group discussion with PowerPoint presentation and patient guidebook. To enhance the learning environment the use of posters, models and videos may be added. At the conclusion of this workshop, patients will comprehend the difference between physical activity and exercise, as well as the benefits of incorporating both, into their routine. Patients will understand the FITT (Frequency, Intensity, Time, and Type) principle and how to use it to build an exercise action plan. In addition, safety concerns and other considerations for exercise and cardiac rehab will be addressed by the presenter. The purpose of this lesson is to promote a comprehensive and effective weekly exercise routine in order to improve patients' overall level of fitness.   Managing Heart Disease: Your Path to a Healthier Heart Clinical staff led group instruction and group discussion with PowerPoint presentation and patient guidebook. To enhance the learning environment the use of posters, models and videos may be added.At the conclusion of this workshop, patients will understand the anatomy and physiology of the heart. Additionally, they will understand how Pritikin's three pillars impact the risk factors, the progression, and the management of heart disease.  The purpose of this lesson is to provide a high-level overview of the heart, heart disease, and how the Pritikin lifestyle positively impacts risk factors.  Exercise Biomechanics Clinical staff led group instruction and group discussion with PowerPoint presentation and patient guidebook. To enhance the learning environment the use of posters, models and videos may be added. Patients will learn how the structural  parts of their bodies function and how these functions impact their daily activities, movement, and exercise. Patients will learn how to promote a neutral spine, learn how to manage pain, and identify ways to improve their physical movement in order to promote healthy living. The purpose of this lesson is to expose patients to common physical limitations that impact physical activity. Participants will learn practical ways to adapt and manage aches and pains, and to minimize their effect on regular exercise. Patients will  learn how to maintain good posture while sitting, walking, and lifting.  Balance Training and Fall Prevention  Clinical staff led group instruction and group discussion with PowerPoint presentation and patient guidebook. To enhance the learning environment the use of posters, models and videos may be added. At the conclusion of this workshop, patients will understand the importance of their sensorimotor skills (vision, proprioception, and the vestibular system) in maintaining their ability to balance as they age. Patients will apply a variety of balancing exercises that are appropriate for their current level of function. Patients will understand the common causes for poor balance, possible solutions to these problems, and ways to modify their physical environment in order to minimize their fall risk. The purpose of this lesson is to teach patients about the importance of maintaining balance as they age and ways to minimize their risk of falling.  WORKSHOPS   Nutrition:  Fueling a Ship broker led group instruction and group discussion with PowerPoint presentation and patient guidebook. To enhance the learning environment the use of posters, models and videos may be added. Patients will review the foundational principles of the Pritikin Eating Plan and understand what constitutes a serving size in each of the food groups. Patients will also learn Pritikin-friendly  foods that are better choices when away from home and review make-ahead meal and snack options. Calorie density will be reviewed and applied to three nutrition priorities: weight maintenance, weight loss, and weight gain. The purpose of this lesson is to reinforce (in a group setting) the key concepts around what patients are recommended to eat and how to apply these guidelines when away from home by planning and selecting Pritikin-friendly options. Patients will understand how calorie density may be adjusted for different weight management goals.  Mindful Eating  Clinical staff led group instruction and group discussion with PowerPoint presentation and patient guidebook. To enhance the learning environment the use of posters, models and videos may be added. Patients will briefly review the concepts of the Pritikin Eating Plan and the importance of low-calorie dense foods. The concept of mindful eating will be introduced as well as the importance of paying attention to internal hunger signals. Triggers for non-hunger eating and techniques for dealing with triggers will be explored. The purpose of this lesson is to provide patients with the opportunity to review the basic principles of the Pritikin Eating Plan, discuss the value of eating mindfully and how to measure internal cues of hunger and fullness using the Hunger Scale. Patients will also discuss reasons for non-hunger eating and learn strategies to use for controlling emotional eating.  Targeting Your Nutrition Priorities Clinical staff led group instruction and group discussion with PowerPoint presentation and patient guidebook. To enhance the learning environment the use of posters, models and videos may be added. Patients will learn how to determine their genetic susceptibility to disease by reviewing their family history. Patients will gain insight into the importance of diet as part of an overall healthy lifestyle in mitigating the impact of  genetics and other environmental insults. The purpose of this lesson is to provide patients with the opportunity to assess their personal nutrition priorities by looking at their family history, their own health history and current risk factors. Patients will also be able to discuss ways of prioritizing and modifying the Pritikin Eating Plan for their highest risk areas  Menu  Clinical staff led group instruction and group discussion with PowerPoint presentation and patient guidebook. To enhance the learning environment the use  of posters, models and videos may be added. Using menus brought in from [employer], or printed from [employer], patients will apply the Pritikin dining out guidelines that were presented in the [employer] video. Patients will also be able to practice these guidelines in a variety of provided scenarios. The purpose of this lesson is to provide patients with the opportunity to practice hands-on learning of the Pritikin Dining Out guidelines with actual menus and practice scenarios.  Label Reading Clinical staff led group instruction and group discussion with PowerPoint presentation and patient guidebook. To enhance the learning environment the use of posters, models and videos may be added. Patients will review and discuss the Pritikin label reading guidelines presented in Pritikin's Label Reading Educational series video. Using fool labels brought in from local grocery stores and markets, patients will apply the label reading guidelines and determine if the packaged food meet the Pritikin guidelines. The purpose of this lesson is to provide patients with the opportunity to review, discuss, and practice hands-on learning of the Pritikin Label Reading guidelines with actual packaged food labels. Cooking School  Pritikin's [employer] are designed to teach patients ways to prepare quick, simple, and affordable recipes at home. The importance of  nutrition's role in chronic disease risk reduction is reflected in its emphasis in the overall Pritikin program. By learning how to prepare essential core Pritikin Eating Plan recipes, patients will increase control over what they eat; be able to customize the flavor of foods without the use of added salt, sugar, or fat; and improve the quality of the food they consume. By learning a set of core recipes which are easily assembled, quickly prepared, and affordable, patients are more likely to prepare more healthy foods at home. These workshops focus on convenient breakfasts, simple entres, side dishes, and desserts which can be prepared with minimal effort and are consistent with nutrition recommendations for cardiovascular risk reduction. Cooking [employer] are taught by a Armed forces logistics/support/administrative officer (RD) who has been trained by the [employer]. The chef or RD has a clear understanding of the importance of minimizing - if not completely eliminating - added fat, sugar, and sodium in recipes. Throughout the series of Cooking School Workshop sessions, patients will learn about healthy ingredients and efficient methods of cooking to build confidence in their capability to prepare    Cooking School weekly topics:  Adding Flavor- Sodium-Free  Fast and Healthy Breakfasts  Powerhouse Plant-Based Proteins  Satisfying Salads and Dressings  Simple Sides and Sauces  International Cuisine-Spotlight on the [employer] Zones  Delicious Desserts  Savory Soups  [employer] - Meals in a Astronomer Appetizers and Snacks  Comforting Weekend Breakfasts  One-Pot Wonders   Fast Evening Meals  Landscape architect Your Pritikin Plate  WORKSHOPS   Healthy Mindset (Psychosocial):  Focused Goals, Sustainable Changes Clinical staff led group instruction and group discussion with PowerPoint presentation and patient guidebook. To enhance the learning environment the use of posters,  models and videos may be added. Patients will be able to apply effective goal setting strategies to establish at least one personal goal, and then take consistent, meaningful action toward that goal. They will learn to identify common barriers to achieving personal goals and develop strategies to overcome them. Patients will also gain an understanding of how our mind-set can impact our ability to achieve goals and the importance of cultivating a positive and growth-oriented mind-set. The purpose of this lesson  is to provide patients with a deeper understanding of how to set and achieve personal goals, as well as the tools and strategies needed to overcome common obstacles which may arise along the way.  From Head to Heart: The Power of a Healthy Outlook  Clinical staff led group instruction and group discussion with PowerPoint presentation and patient guidebook. To enhance the learning environment the use of posters, models and videos may be added. Patients will be able to recognize and describe the impact of emotions and mood on physical health. They will discover the importance of self-care and explore self-care practices which may work for them. Patients will also learn how to utilize the 4 C's to cultivate a healthier outlook and better manage stress and challenges. The purpose of this lesson is to demonstrate to patients how a healthy outlook is an essential part of maintaining good health, especially as they continue their cardiac rehab journey.  Healthy Sleep for a Healthy Heart Clinical staff led group instruction and group discussion with PowerPoint presentation and patient guidebook. To enhance the learning environment the use of posters, models and videos may be added. At the conclusion of this workshop, patients will be able to demonstrate knowledge of the importance of sleep to overall health, well-being, and quality of life. They will understand the symptoms of, and treatments for, common sleep  disorders. Patients will also be able to identify daytime and nighttime behaviors which impact sleep, and they will be able to apply these tools to help manage sleep-related challenges. The purpose of this lesson is to provide patients with a general overview of sleep and outline the importance of quality sleep. Patients will learn about a few of the most common sleep disorders. Patients will also be introduced to the concept of "sleep hygiene," and discover ways to self-manage certain sleeping problems through simple daily behavior changes. Finally, the workshop will motivate patients by clarifying the links between quality sleep and their goals of heart-healthy living.   Recognizing and Reducing Stress Clinical staff led group instruction and group discussion with PowerPoint presentation and patient guidebook. To enhance the learning environment the use of posters, models and videos may be added. At the conclusion of this workshop, patients will be able to understand the types of stress reactions, differentiate between acute and chronic stress, and recognize the impact that chronic stress has on their health. They will also be able to apply different coping mechanisms, such as reframing negative self-talk. Patients will have the opportunity to practice a variety of stress management techniques, such as deep abdominal breathing, progressive muscle relaxation, and/or guided imagery.  The purpose of this lesson is to educate patients on the role of stress in their lives and to provide healthy techniques for coping with it.  Learning Barriers/Preferences:  Learning Barriers/Preferences - 05/24/23 0959       Learning Barriers/Preferences   Learning Barriers None    Learning Preferences Written Material;Skilled Demonstration;Pictoral;Video;Group Instruction;Individual Instruction             Education Topics:  Knowledge Questionnaire Score:  Knowledge Questionnaire Score - 05/24/23 1000        Knowledge Questionnaire Score   Pre Score 21/24             Core Components/Risk Factors/Patient Goals at Admission:  Personal Goals and Risk Factors at Admission - 05/24/23 0943       Core Components/Risk Factors/Patient Goals on Admission    Weight Management Yes;Weight Loss    Intervention Weight Management:  Develop a combined nutrition and exercise program designed to reach desired caloric intake, while maintaining appropriate intake of nutrient and fiber, sodium and fats, and appropriate energy expenditure required for the weight goal.;Weight Management: Provide education and appropriate resources to help participant work on and attain dietary goals.;Weight Management/Obesity: Establish reasonable short term and long term weight goals.;Obesity: Provide education and appropriate resources to help participant work on and attain dietary goals.    Goal Weight: Long Term 234 lb 3.2 oz (106.2 kg)    Hypertension Yes    Intervention Provide education on lifestyle modifcations including regular physical activity/exercise, weight management, moderate sodium restriction and increased consumption of fresh fruit, vegetables, and low fat dairy, alcohol moderation, and smoking cessation.;Monitor prescription use compliance.    Expected Outcomes Short Term: Continued assessment and intervention until BP is < 140/68mm HG in hypertensive participants. < 130/89mm HG in hypertensive participants with diabetes, heart failure or chronic kidney disease.;Long Term: Maintenance of blood pressure at goal levels.    Lipids Yes    Intervention Provide education and support for participant on nutrition & aerobic/resistive exercise along with prescribed medications to achieve LDL 70mg , HDL >40mg .    Expected Outcomes Short Term: Participant states understanding of desired cholesterol values and is compliant with medications prescribed. Participant is following exercise prescription and nutrition guidelines.;Long  Term: Cholesterol controlled with medications as prescribed, with individualized exercise RX and with personalized nutrition plan. Value goals: LDL < 70mg , HDL > 40 mg.    Stress Yes    Intervention Offer individual and/or small group education and counseling on adjustment to heart disease, stress management and health-related lifestyle change. Teach and support self-help strategies.;Refer participants experiencing significant psychosocial distress to appropriate mental health specialists for further evaluation and treatment. When possible, include family members and significant others in education/counseling sessions.    Expected Outcomes Long Term: Emotional wellbeing is indicated by absence of clinically significant psychosocial distress or social isolation.;Short Term: Participant demonstrates changes in health-related behavior, relaxation and other stress management skills, ability to obtain effective social support, and compliance with psychotropic medications if prescribed.    Personal Goal Other Yes    Personal Goal Short: more diet info, form exercise routine Long; flexibility and strength    Intervention Will continue to monitor pt and progress work loads as toleratred without sign or symptom    Expected Outcomes Pt will achieve his goals and gain strength             Core Components/Risk Factors/Patient Goals Review:   Goals and Risk Factor Review     Row Name 05/31/23 1100 06/27/23 0840           Core Components/Risk Factors/Patient Goals Review   Personal Goals Review Weight Management/Obesity;Hypertension;Lipids;Stress Weight Management/Obesity;Hypertension;Lipids;Stress      Review Reilley started cardiac rehab on 05/28/23. Stanislaw did well with exercise. Vital signs were stable Argelio continues to do well with exercise at cardiac rehab. Vital signs have been stable. Almus has lost 2.5 kf since starting cardiac rehab      Expected Outcomes Bradford will continue to participate in cardiac  rehab for exercise, nutrtion and lifestyle modifications Sevin will continue to participate in cardiac rehab for exercise, nutrtion and lifestyle modifications               Core Components/Risk Factors/Patient Goals at Discharge (Final Review):   Goals and Risk Factor Review - 06/27/23 0840       Core Components/Risk Factors/Patient Goals Review   Personal Goals  Review Weight Management/Obesity;Hypertension;Lipids;Stress    Review Alexsandro continues to do well with exercise at cardiac rehab. Vital signs have been stable. Tatum has lost 2.5 kf since starting cardiac rehab    Expected Outcomes Megan will continue to participate in cardiac rehab for exercise, nutrtion and lifestyle modifications             ITP Comments:  ITP Comments     Row Name 05/24/23 0756 05/31/23 1054 06/27/23 0901       ITP Comments Rondell Reams, MD: Medical Director.  Intorduction to the [employer]  Initial orientation packet reviewed with the patient. 30 Day ITP Review. Matilde started cardiac rehab on 05/28/23. Micholas did well on his first day and is off to a good start to exercise. 30 Day ITP Review. Kyrie has good participation and attendance in cardiac rehab              Comments: See ITP comments.Thayer Headings RN BSN

## 2023-06-29 ENCOUNTER — Encounter (HOSPITAL_COMMUNITY): Payer: 59

## 2023-07-02 ENCOUNTER — Encounter (HOSPITAL_COMMUNITY)
Admission: RE | Admit: 2023-07-02 | Discharge: 2023-07-02 | Disposition: A | Discharge status: Home / Self Care | Payer: 59 | Source: Ambulatory Visit | Attending: Cardiovascular Disease | Admitting: Cardiovascular Disease

## 2023-07-02 DIAGNOSIS — Z48812 Encounter for surgical aftercare following surgery on the circulatory system: Secondary | ICD-10-CM | POA: Diagnosis not present

## 2023-07-02 DIAGNOSIS — I2102 ST elevation (STEMI) myocardial infarction involving left anterior descending coronary artery: Secondary | ICD-10-CM | POA: Diagnosis present

## 2023-07-02 DIAGNOSIS — Z955 Presence of coronary angioplasty implant and graft: Secondary | ICD-10-CM | POA: Diagnosis present

## 2023-07-04 ENCOUNTER — Encounter (HOSPITAL_COMMUNITY)
Admission: RE | Admit: 2023-07-04 | Discharge: 2023-07-04 | Disposition: A | Discharge status: Home / Self Care | Payer: 59 | Source: Ambulatory Visit | Attending: Cardiovascular Disease | Admitting: Cardiovascular Disease

## 2023-07-04 DIAGNOSIS — Z955 Presence of coronary angioplasty implant and graft: Secondary | ICD-10-CM

## 2023-07-04 DIAGNOSIS — I2102 ST elevation (STEMI) myocardial infarction involving left anterior descending coronary artery: Secondary | ICD-10-CM

## 2023-07-06 ENCOUNTER — Encounter (HOSPITAL_COMMUNITY)
Admission: RE | Admit: 2023-07-06 | Discharge: 2023-07-06 | Disposition: A | Discharge status: Home / Self Care | Payer: 59 | Source: Ambulatory Visit | Attending: Cardiovascular Disease | Admitting: Cardiovascular Disease

## 2023-07-06 DIAGNOSIS — Z955 Presence of coronary angioplasty implant and graft: Secondary | ICD-10-CM

## 2023-07-06 DIAGNOSIS — I2102 ST elevation (STEMI) myocardial infarction involving left anterior descending coronary artery: Secondary | ICD-10-CM

## 2023-07-09 ENCOUNTER — Encounter (HOSPITAL_COMMUNITY)
Admission: RE | Admit: 2023-07-09 | Discharge: 2023-07-09 | Disposition: A | Discharge status: Home / Self Care | Payer: 59 | Source: Ambulatory Visit | Attending: Cardiovascular Disease | Admitting: Cardiovascular Disease

## 2023-07-09 DIAGNOSIS — I2102 ST elevation (STEMI) myocardial infarction involving left anterior descending coronary artery: Secondary | ICD-10-CM | POA: Diagnosis not present

## 2023-07-09 DIAGNOSIS — Z955 Presence of coronary angioplasty implant and graft: Secondary | ICD-10-CM

## 2023-07-11 ENCOUNTER — Encounter (HOSPITAL_COMMUNITY)
Admission: RE | Admit: 2023-07-11 | Discharge: 2023-07-11 | Disposition: A | Discharge status: Home / Self Care | Payer: 59 | Source: Ambulatory Visit | Attending: Cardiovascular Disease | Admitting: Cardiovascular Disease

## 2023-07-11 DIAGNOSIS — I2102 ST elevation (STEMI) myocardial infarction involving left anterior descending coronary artery: Secondary | ICD-10-CM | POA: Diagnosis not present

## 2023-07-11 DIAGNOSIS — Z955 Presence of coronary angioplasty implant and graft: Secondary | ICD-10-CM

## 2023-07-13 ENCOUNTER — Encounter (HOSPITAL_COMMUNITY)
Admission: RE | Admit: 2023-07-13 | Discharge: 2023-07-13 | Disposition: A | Discharge status: Home / Self Care | Payer: 59 | Source: Ambulatory Visit | Attending: Cardiovascular Disease | Admitting: Cardiovascular Disease

## 2023-07-13 DIAGNOSIS — I2102 ST elevation (STEMI) myocardial infarction involving left anterior descending coronary artery: Secondary | ICD-10-CM

## 2023-07-13 DIAGNOSIS — Z955 Presence of coronary angioplasty implant and graft: Secondary | ICD-10-CM

## 2023-07-16 ENCOUNTER — Encounter (HOSPITAL_COMMUNITY)
Admission: RE | Admit: 2023-07-16 | Discharge: 2023-07-16 | Disposition: A | Discharge status: Home / Self Care | Payer: 59 | Source: Ambulatory Visit | Attending: Cardiovascular Disease | Admitting: Cardiovascular Disease

## 2023-07-16 DIAGNOSIS — I2102 ST elevation (STEMI) myocardial infarction involving left anterior descending coronary artery: Secondary | ICD-10-CM

## 2023-07-16 DIAGNOSIS — Z955 Presence of coronary angioplasty implant and graft: Secondary | ICD-10-CM

## 2023-07-17 LAB — LIPID PANEL
Chol/HDL Ratio: 3.4 {ratio} (ref 0.0–5.0)
Cholesterol, Total: 129 mg/dL (ref 100–199)
HDL: 38 mg/dL — ABNORMAL LOW (ref >39)
LDL Chol Calc (NIH): 74 mg/dL (ref 0–99)
Triglycerides: 85 mg/dL (ref 0–149)
VLDL Cholesterol Cal: 17 mg/dL (ref 5–40)

## 2023-07-17 LAB — HEPATIC FUNCTION PANEL
ALT: 49 [IU]/L — ABNORMAL HIGH (ref 0–44)
AST: 25 [IU]/L (ref 0–40)
Albumin: 4.4 g/dL (ref 4.1–5.1)
Alkaline Phosphatase: 63 [IU]/L (ref 44–121)
Bilirubin Total: 0.4 mg/dL (ref 0.0–1.2)
Bilirubin, Direct: 0.16 mg/dL (ref 0.00–0.40)
Total Protein: 6.7 g/dL (ref 6.0–8.5)

## 2023-07-18 ENCOUNTER — Encounter (HOSPITAL_COMMUNITY)
Admission: RE | Admit: 2023-07-18 | Discharge: 2023-07-18 | Disposition: A | Discharge status: Home / Self Care | Payer: 59 | Source: Ambulatory Visit | Attending: Cardiovascular Disease | Admitting: Cardiovascular Disease

## 2023-07-18 DIAGNOSIS — I2102 ST elevation (STEMI) myocardial infarction involving left anterior descending coronary artery: Secondary | ICD-10-CM

## 2023-07-18 DIAGNOSIS — Z955 Presence of coronary angioplasty implant and graft: Secondary | ICD-10-CM

## 2023-07-19 ENCOUNTER — Other Ambulatory Visit: Payer: Self-pay

## 2023-07-19 DIAGNOSIS — E785 Hyperlipidemia, unspecified: Secondary | ICD-10-CM

## 2023-07-19 DIAGNOSIS — I2510 Atherosclerotic heart disease of native coronary artery without angina pectoris: Secondary | ICD-10-CM

## 2023-07-19 DIAGNOSIS — I10 Essential (primary) hypertension: Secondary | ICD-10-CM

## 2023-07-19 MED ORDER — EZETIMIBE 10 MG PO TABS: 10.0000 mg | ORAL_TABLET | Freq: Every day | ORAL | 3 refills | Status: AC

## 2023-07-20 ENCOUNTER — Encounter (HOSPITAL_COMMUNITY)
Admission: RE | Admit: 2023-07-20 | Discharge: 2023-07-20 | Disposition: A | Discharge status: Home / Self Care | Payer: 59 | Source: Ambulatory Visit | Attending: Cardiovascular Disease | Admitting: Cardiovascular Disease

## 2023-07-20 DIAGNOSIS — Z955 Presence of coronary angioplasty implant and graft: Secondary | ICD-10-CM

## 2023-07-20 DIAGNOSIS — I2102 ST elevation (STEMI) myocardial infarction involving left anterior descending coronary artery: Secondary | ICD-10-CM

## 2023-07-23 ENCOUNTER — Encounter (HOSPITAL_COMMUNITY)
Admission: RE | Admit: 2023-07-23 | Discharge: 2023-07-23 | Disposition: A | Discharge status: Home / Self Care | Payer: 59 | Source: Ambulatory Visit | Attending: Cardiovascular Disease | Admitting: Cardiovascular Disease

## 2023-07-23 DIAGNOSIS — I2102 ST elevation (STEMI) myocardial infarction involving left anterior descending coronary artery: Secondary | ICD-10-CM

## 2023-07-23 DIAGNOSIS — Z955 Presence of coronary angioplasty implant and graft: Secondary | ICD-10-CM

## 2023-07-24 ENCOUNTER — Other Ambulatory Visit: Payer: Self-pay

## 2023-07-24 MED ORDER — ATORVASTATIN CALCIUM 80 MG PO TABS: 80.0000 mg | ORAL_TABLET | Freq: Every day | ORAL | 3 refills | Status: AC

## 2023-07-25 ENCOUNTER — Encounter (HOSPITAL_COMMUNITY)
Admission: RE | Admit: 2023-07-25 | Discharge: 2023-07-25 | Disposition: A | Discharge status: Home / Self Care | Payer: 59 | Source: Ambulatory Visit | Attending: Cardiovascular Disease | Admitting: Cardiovascular Disease

## 2023-07-25 DIAGNOSIS — I2102 ST elevation (STEMI) myocardial infarction involving left anterior descending coronary artery: Secondary | ICD-10-CM | POA: Diagnosis not present

## 2023-07-25 DIAGNOSIS — Z955 Presence of coronary angioplasty implant and graft: Secondary | ICD-10-CM

## 2023-07-27 ENCOUNTER — Encounter (HOSPITAL_COMMUNITY)
Admission: RE | Admit: 2023-07-27 | Discharge: 2023-07-27 | Disposition: A | Discharge status: Home / Self Care | Payer: 59 | Source: Ambulatory Visit | Attending: Cardiovascular Disease | Admitting: Cardiovascular Disease

## 2023-07-27 DIAGNOSIS — Z955 Presence of coronary angioplasty implant and graft: Secondary | ICD-10-CM

## 2023-07-27 DIAGNOSIS — I2102 ST elevation (STEMI) myocardial infarction involving left anterior descending coronary artery: Secondary | ICD-10-CM

## 2023-07-29 NOTE — Progress Notes (Unsigned)
 Cardiology Clinic Note   Patient Name: Christian Foster Date of Encounter: 08/01/2023  Primary Care Provider:  Loyal Jacobson, MD Primary Cardiologist:  Tonny Bollman, MD  Patient Profile    Christian Foster 53 year old male presents to the clinic today for follow-up evaluation of his coronary artery disease and hyperlipidemia.  Past Medical History    Past Medical History:  Diagnosis Date   History of kidney stones    Sleep apnea    Pt states he was told after last surgery that he needed to be tested for sleep apnea. stated he "stopped breathing and had to be bagged" Pt has not had follow up testing   Past Surgical History:  Procedure Laterality Date   APPENDECTOMY     35 yrs ago   CORONARY/GRAFT ACUTE MI REVASCULARIZATION N/A 04/28/2023   Procedure: Coronary/Graft Acute MI Revascularization;  Surgeon: Tonny Bollman, MD;  Location: [facility] INVASIVE CV LAB;  Service: Cardiovascular;  Laterality: N/A;   EAR CYST EXCISION Right 11/26/2014   Procedure: EXCISION OF RIGHT ARM SEBACEOUS CYST;  Surgeon: Abigail Miyamoto, MD;  Location: [facility];  Service: General;  Laterality: Right;   EXTRACORPOREAL SHOCK WAVE LITHOTRIPSY Left 02/04/2018   Procedure: LEFT EXTRACORPOREAL SHOCK WAVE LITHOTRIPSY (ESWL);  Surgeon: Crista Elliot, MD;  Location: [facility];  Service: Urology;  Laterality: Left;   KNEE SURGERY Right 1991   LEFT HEART CATH AND CORONARY ANGIOGRAPHY N/A 04/28/2023   Procedure: LEFT HEART CATH AND CORONARY ANGIOGRAPHY;  Surgeon: Tonny Bollman, MD;  Location: [facility] INVASIVE CV LAB;  Service: Cardiovascular;  Laterality: N/A;   PATELLAR TENDON REPAIR Left feb 2015   WISDOM TOOTH EXTRACTION Bilateral 1992   all 4 removed    Allergies  No Known Allergies  History of Present Illness    Mihir Flanigan has a PMH of coronary artery disease with STEMI 04/28/2023 and DES to his LAD.  His PMH also includes hyperlipidemia, HTN, OSA, and nephrolithiasis.  He was seen by  cardiology during hospitalization.  He had been admitted 04/28/2023 until 04/29/2023.  He was diagnosed with STEMI.  He presented with sudden onset of chest pain.  His EKG showed normal sinus rhythm with ST elevation in anterior lateral leads and reciprocal ST depression in inferior leads.  His high-sensitivity troponin was 16 and 56.  He was taken emergently to the cardiac Cath Lab which showed 95% stenosis of his ostial-proximal LAD.  He received PCI with DES to his LAD.  He was noted to have only minimal disease in his circumflex and RCA.  His echocardiogram showed an LVEF of 70-75% with normal wall motion and G1 DD.  He was started on aspirin and Effient.  He was placed statin therapy.  Beta-blocker therapy was held due to low resting heart rates.  He followed up with Marjie Skiff, PA-C on 05/11/2023.  During that time he reported that he had been doing well since discharge.  He did note some occasional episodes of soreness in the middle of his chest.  These periods of discomfort did not feel like the pain of his prior heart attack.  He denied exertional chest discomfort and pain.  He denied shortness of breath orthopnea and PND.  He did note lightheadedness with standing up too quickly.  His symptoms did not last long.  He denied palpitations and syncope.  His HCTZ was discontinued.  He was started on amlodipine 5 mg daily.  He presents to the clinic today for follow-up evaluation and states  he continues to do well from a heart standpoint.  He has been doing cardiac rehab.  He is planning to start working out at the gym.  He does not have any limitations from a cardiac standpoint.  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  He denies further episodes of lightheadedness or dizziness.  I will plan follow-up in November and have him repeat his fasting lipids and LFTs as scheduled.  Today he denies chest pain, shortness of  breath, lower extremity edema, fatigue, palpitations, melena, hematuria, hemoptysis, diaphoresis, weakness, presyncope, syncope, orthopnea, and PND.   Home Medications    Prior to Admission medications   Medication Sig Start Date End Date Taking? Authorizing Provider  amLODipine (NORVASC) 5 MG tablet Take 1 tablet (5 mg total) by mouth daily. 05/11/23 08/09/23  Corrin Parker, PA-C  aspirin EC 81 MG tablet Take 1 tablet (81 mg total) by mouth daily. Swallow whole. 05/16/23   Marjie Skiff E, PA-C  atorvastatin (LIPITOR) 80 MG tablet Take 1 tablet (80 mg total) by mouth daily. 07/24/23   Marjie Skiff E, PA-C  escitalopram (LEXAPRO) 20 MG tablet Take 20 mg by mouth daily.    [provider]  ezetimibe (ZETIA) 10 MG tablet Take 1 tablet (10 mg total) by mouth daily. 07/19/23 10/17/23  Corrin Parker, PA-C  nitroGLYCERIN (NITROSTAT) 0.4 MG SL tablet Place 1 tablet (0.4 mg total) under the tongue every 5 (five) minutes x 3 doses as needed for chest pain. 04/29/23   Marjie Skiff E, PA-C  prasugrel (EFFIENT) 10 MG TABS tablet Take 1 tablet (10 mg total) by mouth daily. To be filled on 05/30/2023. 04/29/23   Corrin Parker, PA-C    Family History    History reviewed. No pertinent family history. has no family status information on file.   Social History    Social History   Socioeconomic History   Marital status: Married    Spouse name: Not on file   Number of children: Not on file   Years of education: Not on file   Highest education level: Not on file  Occupational History   Not on file  Tobacco Use   Smoking status: Never   Smokeless tobacco: Never  Vaping Use   Vaping status: Never Used  Substance and Sexual Activity   Alcohol use: Yes    Comment: ocassional   Drug use: No   Sexual activity: Not on file  Other Topics Concern   Not on file  Social History Narrative   Not on file   Social Drivers of Health   Financial Resource Strain: Not on file   Food Insecurity: No Food Insecurity (04/29/2023)   Hunger Vital Sign    Worried About Running Out of Food in the Last Year: Never true    Ran Out of Food in the Last Year: Never true  Transportation Needs: No Transportation Needs (04/29/2023)   PRAPARE - Administrator, Civil Service (Medical): No    Lack of Transportation (Non-Medical): No  Physical Activity: Not on file  Stress: Not on file  Social Connections: Not on file  Intimate Partner Violence: Not At Risk (04/29/2023)   Humiliation, Afraid, Rape, and Kick questionnaire    Fear of Current or Ex-Partner: No    Emotionally Abused: No    Physically Abused: No    Sexually Abused: No     Review of  Systems    General:  No chills, fever, night sweats or weight changes.  Cardiovascular:  No chest pain, dyspnea on exertion, edema, orthopnea, palpitations, paroxysmal nocturnal dyspnea. Dermatological: No rash, lesions/masses Respiratory: No cough, dyspnea Urologic: No hematuria, dysuria Abdominal:   No nausea, vomiting, diarrhea, bright red blood per rectum, melena, or hematemesis Neurologic:  No visual changes, wkns, changes in mental status. All other systems reviewed and are otherwise negative except as noted above.  Physical Exam    VS:  BP 118/68 (BP Location: Left Arm, Patient Position: Sitting, Cuff Size: Large)   Pulse 79   Ht 5\' 7"  (1.702 m)   Wt 232 lb 9.6 oz (105.5 kg)   SpO2 94%   BMI 36.43 kg/m  , BMI Body mass index is 36.43 kg/m. GEN: Well nourished, well developed, in no acute distress. HEENT: normal. Neck: Supple, no JVD, carotid bruits, or masses. Cardiac: RRR, no murmurs, rubs, or gallops. No clubbing, cyanosis, edema.  Radials/DP/PT 2+ and equal bilaterally.  Respiratory:  Respirations regular and unlabored, clear to auscultation bilaterally. GI: Soft, nontender, nondistended, BS + x 4. MS: no deformity or atrophy. Skin: warm and dry, no rash. Neuro:  Strength and sensation are  intact. Psych: Normal affect.  Accessory Clinical Findings    Recent Labs: 04/29/2023: Hemoglobin 15.1; Platelets 235 05/11/2023: BUN 21; Creatinine, Ser 1.09; Potassium 4.0; Sodium 139 07/17/2023: ALT 49   Recent Lipid Panel    Component Value Date/Time   CHOL 129 07/17/2023 0816   TRIG 85 07/17/2023 0816   HDL 38 (L) 07/17/2023 0816   CHOLHDL 3.4 07/17/2023 0816   CHOLHDL 5.9 04/28/2023 1419   VLDL 38 04/28/2023 1419   LDLCALC 74 07/17/2023 0816         ECG personally reviewed by me today-none today.    Left Cardiac Catheterization 04/28/2023:   Ost LAD to Prox LAD lesion is 95% stenosed.   A drug-eluting stent was successfully placed using a SYNERGY XD 4.0X16.   Post intervention, there is a 0% residual stenosis.   The left ventricular systolic function is normal.   LV end diastolic pressure is normal.   The left ventricular ejection fraction is 55-65% by visual estimate.   Recommend uninterrupted dual antiplatelet therapy with Aspirin 81mg  daily and Prasugrel 10mg  daily for a minimum of 12 months (ACS-Class I recommendation).   1.  Acute anterior STEMI, treated successfully with primary PCI using a 4.0 x 16 mm Synergy DES 2.  Patent left main, left circumflex, and dominant RCA with mild nonobstructive plaquing noted 3.  Normal LV function with no regional wall motion abnormalities and LVEF estimated at 65%   Post-MI medical therapy. ASA/prasugrel x 12 months without interruption. High-intensity statin Rx. If no complications arise, OK for DC tomorrow.    Diagnostic Dominance: Right  Intervention      _____________   Echocardiogram 04/28/2023: Impressions: 1. Left ventricular ejection fraction, by estimation, is 70 to 75%. The  left ventricle has hyperdynamic function. The left ventricle has no  regional wall motion abnormalities. Left ventricular diastolic parameters  are consistent with Grade I diastolic  dysfunction (impaired relaxation).   2. Right  ventricular systolic function is normal. The right ventricular  size is normal. Tricuspid regurgitation signal is inadequate for assessing  PA pressure.   3. The mitral valve is normal in structure. No evidence of mitral valve  regurgitation. No evidence of mitral stenosis.   4. The aortic valve is tricuspid. Aortic valve regurgitation is not  visualized. No aortic stenosis is present.      Assessment & Plan   1.  Coronary artery disease-denies recent anginal type symptoms.  STEMI 04/28/2023.  He received PCI with DES to his LAD.  He was noted to have only minimal disease in his RCA and circumflex. 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 High-fiber diet Continue ezetimibe, atorvastatin, aspirin Repeat fasting lipids and LFTs 5/25  Essential hypertension-BP today 118/68. Maintain blood pressure log Heart healthy low-sodium diet Continue amlodipine  Disposition: Follow-up with Dr. Excell Seltzer, Marjie Skiff, or me in 6-9 months.   Thomasene Ripple. Mee Macdonnell NP-C     08/01/2023, 1:56 PM [facility] [address] Office [phone] Fax [phone]    I spent 14 minutes examining this patient, reviewing medications, and using patient centered shared decision making involving their cardiac care.   I spent  20 minutes reviewing past medical history,  medications, and prior cardiac tests.

## 2023-07-30 ENCOUNTER — Telehealth (HOSPITAL_COMMUNITY): Payer: Self-pay | Admitting: *Deleted

## 2023-07-30 ENCOUNTER — Encounter (HOSPITAL_COMMUNITY): Admission: RE | Admit: 2023-07-30 | Payer: 59 | Source: Ambulatory Visit

## 2023-07-30 NOTE — Telephone Encounter (Signed)
 Patient left message on department voicemail this morning. He is unable attend cardiac rehab today but will return on Wednesday.

## 2023-07-30 NOTE — Progress Notes (Signed)
 Cardiac Individual Treatment Plan  Patient Details  Name: Christian Foster MRN: [id number] Date of Birth: 1971/04/10 Referring Provider:   Flowsheet Row INTENSIVE CARDIAC REHAB ORIENT from 05/24/2023 in [facility] for Heart, Vascular, & Lung Health  Referring Provider Dr. Tonny Bollman MD       Initial Encounter Date:  Flowsheet Row INTENSIVE CARDIAC REHAB ORIENT from 05/24/2023 in [facility] for Heart, Vascular, & Lung Health  Date 05/24/23       Visit Diagnosis: 04/28/23 ST elevation myocardial infarction involving left anterior descending (LAD) coronary artery (HCC)  04/28/23 Status post coronary artery stent placement LAD  Patient's Home Medications on Admission:  Current Outpatient Medications:    amLODipine (NORVASC) 5 MG tablet, Take 1 tablet (5 mg total) by mouth daily., Disp: 30 tablet, Rfl: 3   aspirin EC 81 MG tablet, Take 1 tablet (81 mg total) by mouth daily. Swallow whole., Disp: 90 tablet, Rfl: 3   atorvastatin (LIPITOR) 80 MG tablet, Take 1 tablet (80 mg total) by mouth daily., Disp: 90 tablet, Rfl: 3   escitalopram (LEXAPRO) 20 MG tablet, Take 20 mg by mouth daily., Disp: , Rfl:    ezetimibe (ZETIA) 10 MG tablet, Take 1 tablet (10 mg total) by mouth daily., Disp: 90 tablet, Rfl: 3   nitroGLYCERIN (NITROSTAT) 0.4 MG SL tablet, Place 1 tablet (0.4 mg total) under the tongue every 5 (five) minutes x 3 doses as needed for chest pain., Disp: 25 tablet, Rfl: 0   prasugrel (EFFIENT) 10 MG TABS tablet, Take 1 tablet (10 mg total) by mouth daily. To be filled on 05/30/2023., Disp: 30 tablet, Rfl: 0  Past Medical History: Past Medical History:  Diagnosis Date   History of kidney stones    Sleep apnea    Pt states he was told after last surgery that he needed to be tested for sleep apnea. stated he "stopped breathing and had to be bagged" Pt has not had follow up testing    Tobacco Use: Social History   Tobacco Use   Smoking Status Never  Smokeless Tobacco Never    Labs: Review Flowsheet       Latest Ref Rng & Units 04/28/2023 07/17/2023  Labs for ITP Cardiac and Pulmonary Rehab  Cholestrol 100 - 199 mg/dL 409  811  914   LDL (calc) 0 - 99 mg/dL 782  956  74   HDL-C >21 mg/dL 41  45  38   Trlycerides 0 - 149 mg/dL 308  657  85   Hemoglobin A1c 4.8 - 5.6 % 5.3  5.2  -  TCO2 22 - 32 mmol/L 25  -    Details       Multiple values from one day are sorted in reverse-chronological order         Capillary Blood Glucose: No results found for: "GLUCAP"   Exercise Target Goals: Exercise Program Goal: Individual exercise prescription set using results from initial 6 min walk test and THRR while considering  patient's activity barriers and safety.   Exercise Prescription Goal: Initial exercise prescription builds to 30-45 minutes a day of aerobic activity, 2-3 days per week.  Home exercise guidelines will be given to patient during program as part of exercise prescription that the participant will acknowledge.  Activity Barriers & Risk Stratification:  Activity Barriers & Cardiac Risk Stratification - 05/24/23 0939       Activity Barriers & Cardiac Risk Stratification   Activity Barriers None  Cardiac Risk Stratification High   under 5 MET's on pre            6 Minute Walk:  6 Minute Walk     Row Name 05/24/23 1014         6 Minute Walk   Phase Initial     Distance 1370 feet     Walk Time 6 minutes     # of Rest Breaks 0     MPH 2.59     METS 3.52     RPE 11     Perceived Dyspnea  0     VO2 Peak 12.3     Symptoms No     Resting HR 76 bpm     Resting BP 118/78     Resting Oxygen Saturation  98 %     Exercise Oxygen Saturation  during 6 min walk 98 %     Max Ex. HR 91 bpm     Max Ex. BP 134/80     2 Minute Post BP 120/78              Oxygen Initial Assessment:   Oxygen Re-Evaluation:   Oxygen Discharge (Final Oxygen Re-Evaluation):   Initial  Exercise Prescription:  Initial Exercise Prescription - 05/24/23 1000       Date of Initial Exercise RX and Referring Provider   Date 05/24/23    Referring Provider Dr. Tonny Bollman MD    Expected Discharge Date 08/15/23      NuStep   Level 2    SPM 80    Minutes 15    METs 2      Recumbant Elliptical   Level 1    RPM 40    Watts 60    Minutes 15    METs 2      Prescription Details   Frequency (times per week) 3    Duration Progress to 30 minutes of continuous aerobic without signs/symptoms of physical distress      Intensity   THRR 40-80% of Max Heartrate 68-136    Ratings of Perceived Exertion 11-13    Perceived Dyspnea 0-4      Progression   Progression Continue progressive overload as per policy without signs/symptoms or physical distress.      Resistance Training   Training Prescription Yes    Weight 3    Reps 10-15             Perform Capillary Blood Glucose checks as needed.  Exercise Prescription Changes:   Exercise Prescription Changes     Row Name 05/28/23 0800 06/15/23 0821 07/20/23 0953         Response to Exercise   Blood Pressure (Admit) 114/74 122/74 116/78     Blood Pressure (Exercise) 124/70 136/66 --     Blood Pressure (Exit) 102/66 122/72 110/80     Heart Rate (Admit) 74 bpm 76 bpm 69 bpm     Heart Rate (Exercise) 112 bpm 113 bpm 108 bpm     Heart Rate (Exit) 75 bpm 85 bpm 78 bpm     Rating of Perceived Exertion (Exercise) 11 9.5 11     Perceived Dyspnea (Exercise) -- 0 0     Symptoms None None None     Comments Pt first day Reviewed MET's and goals Reviewed MET's and goals     Duration Continue with 30 min of aerobic exercise without signs/symptoms of physical distress. Continue with 30 min of aerobic exercise without  signs/symptoms of physical distress. Continue with 30 min of aerobic exercise without signs/symptoms of physical distress.     Intensity THRR unchanged THRR unchanged THRR unchanged       Progression    Progression Continue to progress workloads to maintain intensity without signs/symptoms of physical distress. Continue to progress workloads to maintain intensity without signs/symptoms of physical distress. Continue to progress workloads to maintain intensity without signs/symptoms of physical distress.     Average METs 2.8 3.3 4.35       Resistance Training   Training Prescription Yes Yes Yes     Weight 3 4 5      Reps 10-15 10-15 10-15     Time -- 10 Minutes 10 Minutes       NuStep   Level 2 3 4      SPM 95 99 148     Minutes 15 15 15      METs 2.2 2.7 3.8       Recumbant Elliptical   Level 1 2 3      RPM 56 76 90     Watts 99 105 132     Minutes 15 15 15      METs 3.3 3.9 4.9              Exercise Comments:   Exercise Comments     Row Name 05/28/23 0839 06/15/23 0827 07/20/23 1002       Exercise Comments Pt first day in program, pt tolerated exercise without unusual signs or symptoms. Will continue to watch and increase workload as tolerated. Reviewed MET's and goals. Pt averaged 3.3 Mets and tolerated exercise well. Pt feels good about his goals and has already made some food changes and is increasing strength, continues to work on flexibility training, but is starting to add some in on his own at home. Will review home ExRx soon. Just got back from vacation, will work on routine in [employer] and add more as ready Reviewed MET's and goals. Pt tolerated exercise well with an average MET level of 4.35. Pt is doing well and progressing MET's. He feels good with his exercise routine and feels like he's gaining strength. Asked pt about exercise at home, says he's thinking about getting an elliptical. Encouraged him to try to find something that will work for him at home before graduation so he can continue his progress              Exercise Goals and Review:   Exercise Goals     Row Name 05/24/23 0758             Exercise Goals   Increase Physical Activity Yes        Intervention Provide advice, education, support and counseling about physical activity/exercise needs.;Develop an individualized exercise prescription for aerobic and resistive training based on initial evaluation findings, risk stratification, comorbidities and participant's personal goals.       Expected Outcomes Short Term: Attend rehab on a regular basis to increase amount of physical activity.;Long Term: Add in home exercise to make exercise part of routine and to increase amount of physical activity.;Long Term: Exercising regularly at least 3-5 days a week.       Increase Strength and Stamina Yes       Intervention Provide advice, education, support and counseling about physical activity/exercise needs.;Develop an individualized exercise prescription for aerobic and resistive training based on initial evaluation findings, risk stratification, comorbidities and participant's personal goals.       Expected Outcomes  Short Term: Increase workloads from initial exercise prescription for resistance, speed, and METs.;Short Term: Perform resistance training exercises routinely during rehab and add in resistance training at home;Long Term: Improve cardiorespiratory fitness, muscular endurance and strength as measured by increased METs and functional capacity ( )       Able to understand and use rate of perceived exertion (RPE) scale Yes       Intervention Provide education and explanation on how to use RPE scale       Expected Outcomes Short Term: Able to use RPE daily in rehab to express subjective intensity level;Long Term:  Able to use RPE to guide intensity level when exercising independently       Knowledge and understanding of Target Heart Rate Range (THRR) Yes       Intervention Provide education and explanation of THRR including how the numbers were predicted and where they are located for reference       Expected Outcomes Short Term: Able to state/look up THRR;Long Term: Able to use THRR to govern  intensity when exercising independently;Short Term: Able to use daily as guideline for intensity in rehab       Understanding of Exercise Prescription Yes       Intervention Provide education, explanation, and written materials on patient's individual exercise prescription       Expected Outcomes Short Term: Able to explain program exercise prescription;Long Term: Able to explain home exercise prescription to exercise independently                Exercise Goals Re-Evaluation :  Exercise Goals Re-Evaluation     Row Name 05/28/23 0837 06/15/23 0824 07/20/23 0959         Exercise Goal Re-Evaluation   Exercise Goals Review Increase Physical Activity;Understanding of Exercise Prescription;Increase Strength and Stamina;Knowledge and understanding of Target Heart Rate Range (THRR);Able to understand and use rate of perceived exertion (RPE) scale Increase Physical Activity;Understanding of Exercise Prescription;Increase Strength and Stamina;Knowledge and understanding of Target Heart Rate Range (THRR);Able to understand and use rate of perceived exertion (RPE) scale Increase Physical Activity;Understanding of Exercise Prescription;Increase Strength and Stamina;Knowledge and understanding of Target Heart Rate Range (THRR);Able to understand and use rate of perceived exertion (RPE) scale     Comments Pt first day in program, pt tolerated exercise without unusual signs or symptoms. Pt averaged 2.8 Mets first day, will continue to monitor and increase workload as tolerated. Reviewed MET's and goals. Pt averaged 3.3 Mets and tolerated exercise well. Pt feels good about his goals and has already made some food changes and is increasing strength, continues to work on flexibility training, but is starting to add some in on his own at home. Will review home ExRx soon. Just got back from vacation, will work on routine in [employer] and add more as ready Reviewed MET's and goals. Pt tolerated exercise well with an average  MET level of 4.35. Pt is doing well and progressing MET's. He feels good with his exercise routine and feels like he's gaining strength. Asked pt about exercise at home, says he's thinking about getting an elliptical. Encouraged him to try to find something that will work for him at home before graduation so he can continue his progress     Expected Outcomes Will continue to monitor and increase workload as tolerared. Will continue to monitor and increase workload as tolerared. Will continue to monitor and increase workload as tolerared.  Discharge Exercise Prescription (Final Exercise Prescription Changes):  Exercise Prescription Changes - 07/20/23 0953       Response to Exercise   Blood Pressure (Admit) 116/78    Blood Pressure (Exit) 110/80    Heart Rate (Admit) 69 bpm    Heart Rate (Exercise) 108 bpm    Heart Rate (Exit) 78 bpm    Rating of Perceived Exertion (Exercise) 11    Perceived Dyspnea (Exercise) 0    Symptoms None    Comments Reviewed MET's and goals    Duration Continue with 30 min of aerobic exercise without signs/symptoms of physical distress.    Intensity THRR unchanged      Progression   Progression Continue to progress workloads to maintain intensity without signs/symptoms of physical distress.    Average METs 4.35      Resistance Training   Training Prescription Yes    Weight 5    Reps 10-15    Time 10 Minutes      NuStep   Level 4    SPM 148    Minutes 15    METs 3.8      Recumbant Elliptical   Level 3    RPM 90    Watts 132    Minutes 15    METs 4.9             Nutrition:  Target Goals: Understanding of nutrition guidelines, daily intake of sodium 1500mg , cholesterol 200mg , calories 30% from fat and 7% or less from saturated fats, daily to have 5 or more servings of fruits and vegetables.  Biometrics:  Pre Biometrics - 05/24/23 0917       Pre Biometrics   Waist Circumference 43 inches    Hip Circumference 45 inches     Waist to Hip Ratio 0.96 %    Triceps Skinfold 12 mm    % Body Fat 31 %    Grip Strength 55 kg    Flexibility 4 in    Single Leg Stand 30 seconds              Nutrition Therapy Plan and Nutrition Goals:  Nutrition Therapy & Goals - 07/24/23 0805       Nutrition Therapy   Diet Heart Healthy Diet    Drug/Food Interactions Statins/Certain Fruits      Personal Nutrition Goals   Nutrition Goal Patient to identify strategies for reducing cardiovascular risk by attending the [employer] education and nutrition series weekly.   goal in progress.   Personal Goal #2 Patient to improve diet quality by using the plate method as a guide for meal planning to include lean protein/plant protein, fruits, vegetables, whole grains, nonfat dairy as part of a well-balanced diet.   goal in progress.   Comments Goals in progress. Isaul continues to attend the [employer] and nutrition series regularly. His lipids remain well controlled with on lipitor, zetia; triglycerides improved WNL. HTN is well controlled on amlodipine. He is down 5.5# since starting with our program. Patient will benefit from participation in intensive cardiac rehab for nutrition, exercise, and lifestyle modification.      Intervention Plan   Intervention Prescribe, educate and counsel regarding individualized specific dietary modifications aiming towards targeted core components such as weight, hypertension, lipid management, diabetes, heart failure and other comorbidities.;Nutrition handout(s) given to patient.    Expected Outcomes Short Term Goal: Understand basic principles of dietary content, such as calories, fat, sodium, cholesterol and nutrients.;Short Term Goal: A plan has been developed with  personal nutrition goals set during dietitian appointment.;Long Term Goal: Adherence to prescribed nutrition plan.             Nutrition Assessments:  Nutrition Assessments - 05/24/23 0956       Rate Your Plate Scores   Pre  Score 57            MEDIFICTS Score Key: >=70 Need to make dietary changes  40-70 Heart Healthy Diet <= 40 Therapeutic Level Cholesterol Diet   Flowsheet Row INTENSIVE CARDIAC REHAB ORIENT from 05/24/2023 in [facility] for Heart, Vascular, & Lung Health  Picture Your Plate Total Score on Admission 57      Picture Your Plate Scores: <09 Unhealthy dietary pattern with much room for improvement. 41-50 Dietary pattern unlikely to meet recommendations for good health and room for improvement. 51-60 More healthful dietary pattern, with some room for improvement.  >60 Healthy dietary pattern, although there may be some specific behaviors that could be improved.    Nutrition Goals Re-Evaluation:  Nutrition Goals Re-Evaluation     Row Name 07/24/23 0805             Goals   Current Weight 229 lb 4.5 oz (104 kg)       Comment ALT 49, triglycerides WNL, HDL 38, Lpa WNL       Expected Outcome Goals in progress. Azariah continues to attend the [employer] and nutrition series regularly. His lipids remain well controlled with on lipitor, zetia; triglycerides improved WNL. HTN is well controlled on amlodipine. He is down 5.5# since starting with our program. Patient will benefit from participation in intensive cardiac rehab for nutrition, exercise, and lifestyle modification.                Nutrition Goals Re-Evaluation:  Nutrition Goals Re-Evaluation     Row Name 07/24/23 0805             Goals   Current Weight 229 lb 4.5 oz (104 kg)       Comment ALT 49, triglycerides WNL, HDL 38, Lpa WNL       Expected Outcome Goals in progress. Zandon continues to attend the [employer] and nutrition series regularly. His lipids remain well controlled with on lipitor, zetia; triglycerides improved WNL. HTN is well controlled on amlodipine. He is down 5.5# since starting with our program. Patient will benefit from participation in intensive cardiac rehab  for nutrition, exercise, and lifestyle modification.                Nutrition Goals Discharge (Final Nutrition Goals Re-Evaluation):  Nutrition Goals Re-Evaluation - 07/24/23 0805       Goals   Current Weight 229 lb 4.5 oz (104 kg)    Comment ALT 49, triglycerides WNL, HDL 38, Lpa WNL    Expected Outcome Goals in progress. Arles continues to attend the [employer] and nutrition series regularly. His lipids remain well controlled with on lipitor, zetia; triglycerides improved WNL. HTN is well controlled on amlodipine. He is down 5.5# since starting with our program. Patient will benefit from participation in intensive cardiac rehab for nutrition, exercise, and lifestyle modification.             Psychosocial: Target Goals: Acknowledge presence or absence of significant depression and/or stress, maximize coping skills, provide positive support system. Participant is able to verbalize types and ability to use techniques and skills needed for reducing stress and depression.  Initial Review & Psychosocial Screening:  Initial  Psych Review & Screening - 05/24/23 0940       Initial Review   Current issues with Current Anxiety/Panic;Current Stress Concerns;Current Sleep Concerns    Source of Stress Concerns Family    Comments Pt feels so anxiety and stress due to work and family, But has a good support system with his family and work friends. Pt has no additional needs at this time.      Family Dynamics   Good Support System? Yes      Screening Interventions   Interventions Encouraged to exercise;Provide feedback about the scores to participant    Expected Outcomes Long Term Goal: Stressors or current issues are controlled or eliminated.;Short Term goal: Identification and review with participant of any Quality of Life or Depression concerns found by scoring the questionnaire.;Long Term goal: The participant improves quality of Life and PHQ9 Scores as seen by post scores and/or  verbalization of changes             Quality of Life Scores:  Quality of Life - 05/24/23 0947       Quality of Life   Select Quality of Life      Quality of Life Scores   Health/Function Pre 22.6 %    Socioeconomic Pre 23.97 %    Psych/Spiritual Pre 25.2 %    Family Pre 25.2 %    GLOBAL Pre 22.71 %            Scores of 19 and below usually indicate a poorer quality of life in these areas.  A difference of  2-3 points is a clinically meaningful difference.  A difference of 2-3 points in the total score of the Quality of Life Index has been associated with significant improvement in overall quality of life, self-image, physical symptoms, and general health in studies assessing change in quality of life.  PHQ-9: Review Flowsheet       05/24/2023  Depression screen PHQ 2/9  Decreased Interest 2  Down, Depressed, Hopeless 0  PHQ - 2 Score 2  Altered sleeping 1  Tired, decreased energy 2  Change in appetite 0  Feeling bad or failure about yourself  0  Trouble concentrating 2  Moving slowly or fidgety/restless 0  Suicidal thoughts 0  PHQ-9 Score 7  Difficult doing work/chores Not difficult at all   Interpretation of Total Score  Total Score Depression Severity:  1-4 = Minimal depression, 5-9 = Mild depression, 10-14 = Moderate depression, 15-19 = Moderately severe depression, 20-27 = Severe depression   Psychosocial Evaluation and Intervention:   Psychosocial Re-Evaluation:  Psychosocial Re-Evaluation     Row Name 05/31/23 1056 06/27/23 0838 07/26/23 1013         Psychosocial Re-Evaluation   Current issues with Current Anxiety/Panic;Current Sleep Concerns;Current Stress Concerns None Identified;Current Stress Concerns None Identified     Comments PHQ2-9 review by Girtha Hake RN M[facility] on 05/28/23.PHQ9=7, reviewed with patient who reports doing much better since procedure and sleeping better. Has a supportive family. Shared work has been main source contributing  to the score and is trying to do better managing work related stressors. Patient denies depression and does not have the need for follow-up counseling. Pt exhibits positive coping skills, hopeful outlook with supportive family. No psychosocial needs identified at this time, Pt exhibits positive coping skills, hopeful outlook with supportive family. No psychosocial needs identified at this time, Pt exhibits positive coping skills, hopeful outlook with supportive family. No psychosocial needs identified at this time, Dontray will  complete cardiac rehab on 08/15/23     Expected Outcomes Daisuke will have decreased on controlled anxiety/stressors upon completion of cardiac rehab. Kamari will have decreased on controlled anxiety/stressors upon completion of cardiac rehab. Jalene has not voiced any increased stressors at  cardiac rehab.     Interventions Stress management education;Encouraged to attend Cardiac Rehabilitation for the exercise;Relaxation education Stress management education;Encouraged to attend Cardiac Rehabilitation for the exercise;Relaxation education Stress management education;Encouraged to attend Cardiac Rehabilitation for the exercise;Relaxation education     Continue Psychosocial Services  No Follow up required No Follow up required No Follow up required       Initial Review   Source of Stress Concerns Chronic Illness;Family;Occupation Chronic Illness;Family;Occupation Chronic Illness;Family;Occupation     Comments Will continue to [employer] and offer support as needed. Will continue to [employer] and offer support as needed. Will continue to [employer] and offer support as needed.              Psychosocial Discharge (Final Psychosocial Re-Evaluation):  Psychosocial Re-Evaluation - 07/26/23 1013       Psychosocial Re-Evaluation   Current issues with None Identified    Comments Pt exhibits positive coping skills, hopeful outlook with supportive family. No psychosocial needs identified at this  time, Quaron will complete cardiac rehab on 08/15/23    Expected Outcomes Blaze has not voiced any increased stressors at  cardiac rehab.    Interventions Stress management education;Encouraged to attend Cardiac Rehabilitation for the exercise;Relaxation education    Continue Psychosocial Services  No Follow up required      Initial Review   Source of Stress Concerns Chronic Illness;Family;Occupation    Comments Will continue to [employer] and offer support as needed.             Vocational Rehabilitation: Provide vocational rehab assistance to qualifying candidates.   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            Education: Education Goals: Education classes will be provided on a weekly basis, covering required topics. Participant will state understanding/return demonstration of topics presented.    Education     Row Name 05/28/23 0900     Education   Cardiac Education Topics [employer]   [employer]     Workshops   Educator Exercise Physiologist   Select Psychosocial   Psychosocial Workshop Focused Goals, Sustainable Changes   Instruction Review Code 1- Verbalizes Understanding   Class Start Time 0815   Class Stop Time 0848   Class Time Calculation (min) 33 min    Row Name 06/06/23 1100     Education   Cardiac Education Topics Pritikin   Secondary school teacher School   Educator Nurse;Respiratory Therapist   Weekly Topic Comforting Weekend Breakfasts   Instruction Review Code 1- Verbalizes Understanding   Class Start Time 0818   Class Stop Time 0858   Class Time Calculation (min) 40 min    Row Name 06/15/23 0900     Education   Cardiac Education Topics Pritikin   Glass blower/designer Nutrition    Nutrition Workshop Fueling a Forensic psychologist   Instruction Review Code 1- Tax inspector   Class Start Time [phone]  Class Stop Time 0850   Class Time Calculation (min) 35 min    Row Name 06/18/23 1300     Education   Cardiac Education Topics Pritikin   Select Workshops     Workshops   Educator Exercise Physiologist   Select Psychosocial   Psychosocial Workshop Healthy Sleep for a Healthy Heart   Instruction Review Code 1- Verbalizes Understanding   Class Start Time [phone]   Class Stop Time 0857   Class Time Calculation (min) 43 min    Row Name 06/22/23 1600     Education   Cardiac Education Topics Pritikin   Nurse, children's Exercise Physiologist   Select Psychosocial   Psychosocial How Our Thoughts Can Heal Our Hearts   Instruction Review Code 1- Verbalizes Understanding   Class Start Time 1358   Class Stop Time 1435   Class Time Calculation (min) 37 min    Row Name 07/06/23 0800     Education   Cardiac Education Topics Pritikin   Select Core Videos     Core Videos   Educator Nurse   Select General Education   General Education Heart Disease Risk Reduction   Instruction Review Code 1- Verbalizes Understanding   Class Start Time 0813   Class Stop Time 0846   Class Time Calculation (min) 33 min            Core Videos: Exercise    Move It!  Clinical staff conducted group or individual video education with verbal and written material and guidebook.  Patient learns the recommended Pritikin exercise program. Exercise with the goal of living a long, healthy life. Some of the health benefits of exercise include controlled diabetes, healthier blood pressure levels, improved cholesterol levels, improved heart and lung capacity, improved sleep, and better body composition. Everyone should speak with their doctor before starting or changing an exercise routine.  Biomechanical Limitations Clinical staff conducted group or individual  video education with verbal and written material and guidebook.  Patient learns how biomechanical limitations can impact exercise and how we can mitigate and possibly overcome limitations to have an impactful and balanced exercise routine.  Body Composition Clinical staff conducted group or individual video education with verbal and written material and guidebook.  Patient learns that body composition (ratio of muscle mass to fat mass) is a key component to assessing overall fitness, rather than body weight alone. Increased fat mass, especially visceral belly fat, can put [place] at increased risk for metabolic syndrome, type 2 diabetes, heart disease, and even death. It is recommended to combine diet and exercise (cardiovascular and resistance training) to improve your body composition. Seek guidance from your physician and exercise physiologist before implementing an exercise routine.  Exercise Action Plan Clinical staff conducted group or individual video education with verbal and written material and guidebook.  Patient learns the recommended strategies to achieve and enjoy long-term exercise adherence, including variety, self-motivation, self-efficacy, and positive decision making. Benefits of exercise include fitness, good health, weight management, more energy, better sleep, less stress, and overall well-being.  Medical   Heart Disease Risk Reduction Clinical staff conducted group or individual video education with verbal and written material and guidebook.  Patient learns our heart is our most vital organ as it circulates oxygen, nutrients, white blood cells, and hormones throughout the entire body, and carries waste away. Data supports a plant-based eating plan like the Pritikin Program for its effectiveness in slowing progression of and  reversing heart disease. The video provides a number of recommendations to address heart disease.   Metabolic Syndrome and Belly Fat  Clinical staff conducted  group or individual video education with verbal and written material and guidebook.  Patient learns what metabolic syndrome is, how it leads to heart disease, and how one can reverse it and keep it from coming back. You have metabolic syndrome if you have 3 of the following 5 criteria: abdominal obesity, high blood pressure, high triglycerides, low HDL cholesterol, and high blood sugar.  Hypertension and Heart Disease Clinical staff conducted group or individual video education with verbal and written material and guidebook.  Patient learns that high blood pressure, or hypertension, is very common in the [place]. Hypertension is largely due to excessive salt intake, but other important risk factors include being overweight, physical inactivity, drinking too much alcohol, smoking, and not eating enough potassium from fruits and vegetables. High blood pressure is a leading risk factor for heart attack, stroke, congestive heart failure, dementia, kidney failure, and premature death. Long-term effects of excessive salt intake include stiffening of the arteries and thickening of heart muscle and organ damage. Recommendations include ways to reduce hypertension and the risk of heart disease.  Diseases of Our Time - Focusing on Diabetes Clinical staff conducted group or individual video education with verbal and written material and guidebook.  Patient learns why the best way to stop diseases of our time is prevention, through food and other lifestyle changes. Medicine (such as prescription pills and surgeries) is often only a Band-Aid on the problem, not a long-term solution. Most common diseases of our time include obesity, type 2 diabetes, hypertension, heart disease, and cancer. The Pritikin Program is recommended and has been proven to help reduce, reverse, and/or prevent the damaging effects of metabolic syndrome.  Nutrition   Overview of the Pritikin Eating Plan  Clinical staff conducted group or  individual video education with verbal and written material and guidebook.  Patient learns about the Pritikin Eating Plan for disease risk reduction. The Pritikin Eating Plan emphasizes a wide variety of unrefined, minimally-processed carbohydrates, like fruits, vegetables, whole grains, and legumes. Go, Caution, and Stop food choices are explained. Plant-based and lean animal proteins are emphasized. Rationale provided for low sodium intake for blood pressure control, low added sugars for blood sugar stabilization, and low added fats and oils for coronary artery disease risk reduction and weight management.  Calorie Density  Clinical staff conducted group or individual video education with verbal and written material and guidebook.  Patient learns about calorie density and how it impacts the Pritikin Eating Plan. Knowing the characteristics of the food you choose will help you decide whether those foods will lead to weight gain or weight loss, and whether you want to consume more or less of them. Weight loss is usually a side effect of the Pritikin Eating Plan because of its focus on low calorie-dense foods.  Label Reading  Clinical staff conducted group or individual video education with verbal and written material and guidebook.  Patient learns about the Pritikin recommended label reading guidelines and corresponding recommendations regarding calorie density, added sugars, sodium content, and whole grains.  Dining Out - Part 1  Clinical staff conducted group or individual video education with verbal and written material and guidebook.  Patient learns that restaurant meals can be sabotaging because they can be so high in calories, fat, sodium, and/or sugar. Patient learns recommended strategies on how to positively address this and avoid  unhealthy pitfalls.  Facts on Fats  Clinical staff conducted group or individual video education with verbal and written material and guidebook.  Patient learns  that lifestyle modifications can be just as effective, if not more so, as many medications for lowering your risk of heart disease. A Pritikin lifestyle can help to reduce your risk of inflammation and atherosclerosis (cholesterol build-up, or plaque, in the artery walls). Lifestyle interventions such as dietary choices and physical activity address the cause of atherosclerosis. A review of the types of fats and their impact on blood cholesterol levels, along with dietary recommendations to reduce fat intake is also included.  Nutrition Action Plan  Clinical staff conducted group or individual video education with verbal and written material and guidebook.  Patient learns how to incorporate Pritikin recommendations into their lifestyle. Recommendations include planning and keeping personal health goals in mind as an important part of their success.  Healthy Mind-Set    Healthy Minds, Bodies, Hearts  Clinical staff conducted group or individual video education with verbal and written material and guidebook.  Patient learns how to identify when they are stressed. Video will discuss the impact of that stress, as well as the many benefits of stress management. Patient will also be introduced to stress management techniques. The way we think, act, and feel has an impact on our hearts.  How Our Thoughts Can Heal Our Hearts  Clinical staff conducted group or individual video education with verbal and written material and guidebook.  Patient learns that negative thoughts can cause depression and anxiety. This can result in negative lifestyle behavior and serious health problems. Cognitive behavioral therapy is an effective method to help control our thoughts in order to change and improve our emotional outlook.  Additional Videos:  Exercise    Improving Performance  Clinical staff conducted group or individual video education with verbal and written material and guidebook.  Patient learns to use a  non-linear approach by alternating intensity levels and lengths of time spent exercising to help burn more calories and lose more body fat. Cardiovascular exercise helps improve heart health, metabolism, hormonal balance, blood sugar control, and recovery from fatigue. Resistance training improves strength, endurance, balance, coordination, reaction time, metabolism, and muscle mass. Flexibility exercise improves circulation, posture, and balance. Seek guidance from your physician and exercise physiologist before implementing an exercise routine and learn your capabilities and proper form for all exercise.  Introduction to Yoga  Clinical staff conducted group or individual video education with verbal and written material and guidebook.  Patient learns about yoga, a discipline of the coming together of mind, breath, and body. The benefits of yoga include improved flexibility, improved range of motion, better posture and core strength, increased lung function, weight loss, and positive self-image. Yoga's heart health benefits include lowered blood pressure, healthier heart rate, decreased cholesterol and triglyceride levels, improved immune function, and reduced stress. Seek guidance from your physician and exercise physiologist before implementing an exercise routine and learn your capabilities and proper form for all exercise.  Medical   Aging: Enhancing Your Quality of Life  Clinical staff conducted group or individual video education with verbal and written material and guidebook.  Patient learns key strategies and recommendations to stay in good physical health and enhance quality of life, such as prevention strategies, having an advocate, securing a Health Care Proxy and Power of Attorney, and keeping a list of medications and system for tracking them. It also discusses how to avoid risk for bone loss.  Biology  of Weight Control  Clinical staff conducted group or individual video education with  verbal and written material and guidebook.  Patient learns that weight gain occurs because we consume more calories than we burn (eating more, moving less). Even if your body weight is normal, you may have higher ratios of fat compared to muscle mass. Too much body fat puts you at increased risk for cardiovascular disease, heart attack, stroke, type 2 diabetes, and obesity-related cancers. In addition to exercise, following the Pritikin Eating Plan can help reduce your risk.  Decoding Lab Results  Clinical staff conducted group or individual video education with verbal and written material and guidebook.  Patient learns that lab test reflects one measurement whose values change over time and are influenced by many factors, including medication, stress, sleep, exercise, food, hydration, pre-existing medical conditions, and more. It is recommended to use the knowledge from this video to become more involved with your lab results and evaluate your numbers to speak with your doctor.   Diseases of Our Time - Overview  Clinical staff conducted group or individual video education with verbal and written material and guidebook.  Patient learns that according to the CDC, 50% to 70% of chronic diseases (such as obesity, type 2 diabetes, elevated lipids, hypertension, and heart disease) are avoidable through lifestyle improvements including healthier food choices, listening to satiety cues, and increased physical activity.  Sleep Disorders Clinical staff conducted group or individual video education with verbal and written material and guidebook.  Patient learns how good quality and duration of sleep are important to overall health and well-being. Patient also learns about sleep disorders and how they impact health along with recommendations to address them, including discussing with a physician.  Nutrition  Dining Out - Part 2 Clinical staff conducted group or individual video education with verbal and  written material and guidebook.  Patient learns how to plan ahead and communicate in order to maximize their dining experience in a healthy and nutritious manner. Included are recommended food choices based on the type of restaurant the patient is visiting.   Fueling a Banker conducted group or individual video education with verbal and written material and guidebook.  There is a strong connection between our food choices and our health. Diseases like obesity and type 2 diabetes are very prevalent and are in large-part due to lifestyle choices. The Pritikin Eating Plan provides plenty of food and hunger-curbing satisfaction. It is easy to follow, affordable, and helps reduce health risks.  Menu Workshop  Clinical staff conducted group or individual video education with verbal and written material and guidebook.  Patient learns that restaurant meals can sabotage health goals because they are often packed with calories, fat, sodium, and sugar. Recommendations include strategies to plan ahead and to communicate with the manager, chef, or server to help order a healthier meal.  Planning Your Eating Strategy  Clinical staff conducted group or individual video education with verbal and written material and guidebook.  Patient learns about the Pritikin Eating Plan and its benefit of reducing the risk of disease. The Pritikin Eating Plan does not focus on calories. Instead, it emphasizes high-quality, nutrient-rich foods. By knowing the characteristics of the foods, we choose, we can determine their calorie density and make informed decisions.  Targeting Your Nutrition Priorities  Clinical staff conducted group or individual video education with verbal and written material and guidebook.  Patient learns that lifestyle habits have a tremendous impact on disease risk and progression.  This video provides eating and physical activity recommendations based on your personal health goals,  such as reducing LDL cholesterol, losing weight, preventing or controlling type 2 diabetes, and reducing high blood pressure.  Vitamins and Minerals  Clinical staff conducted group or individual video education with verbal and written material and guidebook.  Patient learns different ways to obtain key vitamins and minerals, including through a recommended healthy diet. It is important to discuss all supplements you take with your doctor.   Healthy Mind-Set    Smoking Cessation  Clinical staff conducted group or individual video education with verbal and written material and guidebook.  Patient learns that cigarette smoking and tobacco addiction pose a serious health risk which affects millions of people. Stopping smoking will significantly reduce the risk of heart disease, lung disease, and many forms of cancer. Recommended strategies for quitting are covered, including working with your doctor to develop a successful plan.  Culinary   Becoming a Set designer conducted group or individual video education with verbal and written material and guidebook.  Patient learns that cooking at home can be healthy, cost-effective, quick, and puts them in control. Keys to cooking healthy recipes will include looking at your recipe, assessing your equipment needs, planning ahead, making it simple, choosing cost-effective seasonal ingredients, and limiting the use of added fats, salts, and sugars.  Cooking - Breakfast and Snacks  Clinical staff conducted group or individual video education with verbal and written material and guidebook.  Patient learns how important breakfast is to satiety and nutrition through the entire day. Recommendations include key foods to eat during breakfast to help stabilize blood sugar levels and to prevent overeating at meals later in the day. Planning ahead is also a key component.  Cooking - Educational psychologist conducted group or individual video  education with verbal and written material and guidebook.  Patient learns eating strategies to improve overall health, including an approach to cook more at home. Recommendations include thinking of animal protein as a side on your plate rather than center stage and focusing instead on lower calorie dense options like vegetables, fruits, whole grains, and plant-based proteins, such as beans. Making sauces in large quantities to freeze for later and leaving the skin on your vegetables are also recommended to maximize your experience.  Cooking - Healthy Salads and Dressing Clinical staff conducted group or individual video education with verbal and written material and guidebook.  Patient learns that vegetables, fruits, whole grains, and legumes are the foundations of the Pritikin Eating Plan. Recommendations include how to incorporate each of these in flavorful and healthy salads, and how to create homemade salad dressings. Proper handling of ingredients is also covered. Cooking - Soups and [employer] - Soups and Desserts Clinical staff conducted group or individual video education with verbal and written material and guidebook.  Patient learns that Pritikin soups and desserts make for easy, nutritious, and delicious snacks and meal components that are low in sodium, fat, sugar, and calorie density, while high in vitamins, minerals, and filling fiber. Recommendations include simple and healthy ideas for soups and desserts.   Overview     The Pritikin Solution Program Overview Clinical staff conducted group or individual video education with verbal and written material and guidebook.  Patient learns that the results of the Pritikin Program have been documented in more than 100 articles published in peer-reviewed journals, and the benefits include reducing risk factors for (and, in some cases,  even reversing) high cholesterol, high blood pressure, type 2 diabetes, obesity, and more! An overview of  the three key pillars of the Pritikin Program will be covered: eating well, doing regular exercise, and having a healthy mind-set.  WORKSHOPS  Exercise: Exercise Basics: Building Your Action Plan Clinical staff led group instruction and group discussion with PowerPoint presentation and patient guidebook. To enhance the learning environment the use of posters, models and videos may be added. At the conclusion of this workshop, patients will comprehend the difference between physical activity and exercise, as well as the benefits of incorporating both, into their routine. Patients will understand the FITT (Frequency, Intensity, Time, and Type) principle and how to use it to build an exercise action plan. In addition, safety concerns and other considerations for exercise and cardiac rehab will be addressed by the presenter. The purpose of this lesson is to promote a comprehensive and effective weekly exercise routine in order to improve patients' overall level of fitness.   Managing Heart Disease: Your Path to a Healthier Heart Clinical staff led group instruction and group discussion with PowerPoint presentation and patient guidebook. To enhance the learning environment the use of posters, models and videos may be added.At the conclusion of this workshop, patients will understand the anatomy and physiology of the heart. Additionally, they will understand how Pritikin's three pillars impact the risk factors, the progression, and the management of heart disease.  The purpose of this lesson is to provide a high-level overview of the heart, heart disease, and how the Pritikin lifestyle positively impacts risk factors.  Exercise Biomechanics Clinical staff led group instruction and group discussion with PowerPoint presentation and patient guidebook. To enhance the learning environment the use of posters, models and videos may be added. Patients will learn how the structural parts of their bodies  function and how these functions impact their daily activities, movement, and exercise. Patients will learn how to promote a neutral spine, learn how to manage pain, and identify ways to improve their physical movement in order to promote healthy living. The purpose of this lesson is to expose patients to common physical limitations that impact physical activity. Participants will learn practical ways to adapt and manage aches and pains, and to minimize their effect on regular exercise. Patients will learn how to maintain good posture while sitting, walking, and lifting.  Balance Training and Fall Prevention  Clinical staff led group instruction and group discussion with PowerPoint presentation and patient guidebook. To enhance the learning environment the use of posters, models and videos may be added. At the conclusion of this workshop, patients will understand the importance of their sensorimotor skills (vision, proprioception, and the vestibular system) in maintaining their ability to balance as they age. Patients will apply a variety of balancing exercises that are appropriate for their current level of function. Patients will understand the common causes for poor balance, possible solutions to these problems, and ways to modify their physical environment in order to minimize their fall risk. The purpose of this lesson is to teach patients about the importance of maintaining balance as they age and ways to minimize their risk of falling.  WORKSHOPS   Nutrition:  Fueling a Ship broker led group instruction and group discussion with PowerPoint presentation and patient guidebook. To enhance the learning environment the use of posters, models and videos may be added. Patients will review the foundational principles of the Pritikin Eating Plan and understand what constitutes a serving size in each of the  food groups. Patients will also learn Pritikin-friendly foods that are better  choices when away from home and review make-ahead meal and snack options. Calorie density will be reviewed and applied to three nutrition priorities: weight maintenance, weight loss, and weight gain. The purpose of this lesson is to reinforce (in a group setting) the key concepts around what patients are recommended to eat and how to apply these guidelines when away from home by planning and selecting Pritikin-friendly options. Patients will understand how calorie density may be adjusted for different weight management goals.  Mindful Eating  Clinical staff led group instruction and group discussion with PowerPoint presentation and patient guidebook. To enhance the learning environment the use of posters, models and videos may be added. Patients will briefly review the concepts of the Pritikin Eating Plan and the importance of low-calorie dense foods. The concept of mindful eating will be introduced as well as the importance of paying attention to internal hunger signals. Triggers for non-hunger eating and techniques for dealing with triggers will be explored. The purpose of this lesson is to provide patients with the opportunity to review the basic principles of the Pritikin Eating Plan, discuss the value of eating mindfully and how to measure internal cues of hunger and fullness using the Hunger Scale. Patients will also discuss reasons for non-hunger eating and learn strategies to use for controlling emotional eating.  Targeting Your Nutrition Priorities Clinical staff led group instruction and group discussion with PowerPoint presentation and patient guidebook. To enhance the learning environment the use of posters, models and videos may be added. Patients will learn how to determine their genetic susceptibility to disease by reviewing their family history. Patients will gain insight into the importance of diet as part of an overall healthy lifestyle in mitigating the impact of genetics and other  environmental insults. The purpose of this lesson is to provide patients with the opportunity to assess their personal nutrition priorities by looking at their family history, their own health history and current risk factors. Patients will also be able to discuss ways of prioritizing and modifying the Pritikin Eating Plan for their highest risk areas  Menu  Clinical staff led group instruction and group discussion with PowerPoint presentation and patient guidebook. To enhance the learning environment the use of posters, models and videos may be added. Using menus brought in from [employer], or printed from [employer], patients will apply the Pritikin dining out guidelines that were presented in the [employer] video. Patients will also be able to practice these guidelines in a variety of provided scenarios. The purpose of this lesson is to provide patients with the opportunity to practice hands-on learning of the Pritikin Dining Out guidelines with actual menus and practice scenarios.  Label Reading Clinical staff led group instruction and group discussion with PowerPoint presentation and patient guidebook. To enhance the learning environment the use of posters, models and videos may be added. Patients will review and discuss the Pritikin label reading guidelines presented in [employer]'s Label Reading Educational series video. Using fool labels brought in from local grocery stores and markets, patients will apply the label reading guidelines and determine if the packaged food meet the Pritikin guidelines. The purpose of this lesson is to provide patients with the opportunity to review, discuss, and practice hands-on learning of the Pritikin Label Reading guidelines with actual packaged food labels. Cooking School  Pritikin's [employer] are designed to teach patients ways to prepare quick, simple, and affordable recipes  at home. The importance of nutrition's role in  chronic disease risk reduction is reflected in its emphasis in the overall Pritikin program. By learning how to prepare essential core Pritikin Eating Plan recipes, patients will increase control over what they eat; be able to customize the flavor of foods without the use of added salt, sugar, or fat; and improve the quality of the food they consume. By learning a set of core recipes which are easily assembled, quickly prepared, and affordable, patients are more likely to prepare more healthy foods at home. These workshops focus on convenient breakfasts, simple entres, side dishes, and desserts which can be prepared with minimal effort and are consistent with nutrition recommendations for cardiovascular risk reduction. Cooking [employer] are taught by a Armed forces logistics/support/administrative officer (RD) who has been trained by the [employer]. The chef or RD has a clear understanding of the importance of minimizing - if not completely eliminating - added fat, sugar, and sodium in recipes. Throughout the series of Cooking School Workshop sessions, patients will learn about healthy ingredients and efficient methods of cooking to build confidence in their capability to prepare    Cooking School weekly topics:  Adding Flavor- Sodium-Free  Fast and Healthy Breakfasts  Powerhouse Plant-Based Proteins  Satisfying Salads and Dressings  Simple Sides and Sauces  International Cuisine-Spotlight on the [employer] Zones  Delicious Desserts  Savory Soups  [employer] - Meals in a Astronomer Appetizers and Snacks  Comforting Weekend Breakfasts  One-Pot Wonders   Fast Evening Meals  Landscape architect Your Pritikin Plate  WORKSHOPS   Healthy Mindset (Psychosocial):  Focused Goals, Sustainable Changes Clinical staff led group instruction and group discussion with PowerPoint presentation and patient guidebook. To enhance the learning environment the use of posters, models and videos may  be added. Patients will be able to apply effective goal setting strategies to establish at least one personal goal, and then take consistent, meaningful action toward that goal. They will learn to identify common barriers to achieving personal goals and develop strategies to overcome them. Patients will also gain an understanding of how our mind-set can impact our ability to achieve goals and the importance of cultivating a positive and growth-oriented mind-set. The purpose of this lesson is to provide patients with a deeper understanding of how to set and achieve personal goals, as well as the tools and strategies needed to overcome common obstacles which may arise along the way.  From Head to Heart: The Power of a Healthy Outlook  Clinical staff led group instruction and group discussion with PowerPoint presentation and patient guidebook. To enhance the learning environment the use of posters, models and videos may be added. Patients will be able to recognize and describe the impact of emotions and mood on physical health. They will discover the importance of self-care and explore self-care practices which may work for them. Patients will also learn how to utilize the 4 C's to cultivate a healthier outlook and better manage stress and challenges. The purpose of this lesson is to demonstrate to patients how a healthy outlook is an essential part of maintaining good health, especially as they continue their cardiac rehab journey.  Healthy Sleep for a Healthy Heart Clinical staff led group instruction and group discussion with PowerPoint presentation and patient guidebook. To enhance the learning environment the use of posters, models and videos may be added. At the conclusion of this workshop, patients will be able to demonstrate knowledge of  the importance of sleep to overall health, well-being, and quality of life. They will understand the symptoms of, and treatments for, common sleep disorders. Patients  will also be able to identify daytime and nighttime behaviors which impact sleep, and they will be able to apply these tools to help manage sleep-related challenges. The purpose of this lesson is to provide patients with a general overview of sleep and outline the importance of quality sleep. Patients will learn about a few of the most common sleep disorders. Patients will also be introduced to the concept of "sleep hygiene," and discover ways to self-manage certain sleeping problems through simple daily behavior changes. Finally, the workshop will motivate patients by clarifying the links between quality sleep and their goals of heart-healthy living.   Recognizing and Reducing Stress Clinical staff led group instruction and group discussion with PowerPoint presentation and patient guidebook. To enhance the learning environment the use of posters, models and videos may be added. At the conclusion of this workshop, patients will be able to understand the types of stress reactions, differentiate between acute and chronic stress, and recognize the impact that chronic stress has on their health. They will also be able to apply different coping mechanisms, such as reframing negative self-talk. Patients will have the opportunity to practice a variety of stress management techniques, such as deep abdominal breathing, progressive muscle relaxation, and/or guided imagery.  The purpose of this lesson is to educate patients on the role of stress in their lives and to provide healthy techniques for coping with it.  Learning Barriers/Preferences:  Learning Barriers/Preferences - 05/24/23 0959       Learning Barriers/Preferences   Learning Barriers None    Learning Preferences Written Material;Skilled Demonstration;Pictoral;Video;Group Instruction;Individual Instruction             Education Topics:  Knowledge Questionnaire Score:  Knowledge Questionnaire Score - 05/24/23 1000       Knowledge  Questionnaire Score   Pre Score 21/24             Core Components/Risk Factors/Patient Goals at Admission:  Personal Goals and Risk Factors at Admission - 05/24/23 0943       Core Components/Risk Factors/Patient Goals on Admission    Weight Management Yes;Weight Loss    Intervention Weight Management: Develop a combined nutrition and exercise program designed to reach desired caloric intake, while maintaining appropriate intake of nutrient and fiber, sodium and fats, and appropriate energy expenditure required for the weight goal.;Weight Management: Provide education and appropriate resources to help participant work on and attain dietary goals.;Weight Management/Obesity: Establish reasonable short term and long term weight goals.;Obesity: Provide education and appropriate resources to help participant work on and attain dietary goals.    Goal Weight: Long Term 234 lb 3.2 oz (106.2 kg)    Hypertension Yes    Intervention Provide education on lifestyle modifcations including regular physical activity/exercise, weight management, moderate sodium restriction and increased consumption of fresh fruit, vegetables, and low fat dairy, alcohol moderation, and smoking cessation.;Monitor prescription use compliance.    Expected Outcomes Short Term: Continued assessment and intervention until BP is < 140/75mm HG in hypertensive participants. < 130/58mm HG in hypertensive participants with diabetes, heart failure or chronic kidney disease.;Long Term: Maintenance of blood pressure at goal levels.    Lipids Yes    Intervention Provide education and support for participant on nutrition & aerobic/resistive exercise along with prescribed medications to achieve LDL 70mg , HDL >40mg .    Expected Outcomes Short Term: Participant states understanding  of desired cholesterol values and is compliant with medications prescribed. Participant is following exercise prescription and nutrition guidelines.;Long Term:  Cholesterol controlled with medications as prescribed, with individualized exercise RX and with personalized nutrition plan. Value goals: LDL < 70mg , HDL > 40 mg.    Stress Yes    Intervention Offer individual and/or small group education and counseling on adjustment to heart disease, stress management and health-related lifestyle change. Teach and support self-help strategies.;Refer participants experiencing significant psychosocial distress to appropriate mental health specialists for further evaluation and treatment. When possible, include family members and significant others in education/counseling sessions.    Expected Outcomes Long Term: Emotional wellbeing is indicated by absence of clinically significant psychosocial distress or social isolation.;Short Term: Participant demonstrates changes in health-related behavior, relaxation and other stress management skills, ability to obtain effective social support, and compliance with psychotropic medications if prescribed.    Personal Goal Other Yes    Personal Goal Short: more diet info, form exercise routine Long; flexibility and strength    Intervention Will continue to monitor pt and progress work loads as toleratred without sign or symptom    Expected Outcomes Pt will achieve his goals and gain strength             Core Components/Risk Factors/Patient Goals Review:   Goals and Risk Factor Review     Row Name 05/31/23 1100 06/27/23 0840 07/26/23 1350         Core Components/Risk Factors/Patient Goals Review   Personal Goals Review Weight Management/Obesity;Hypertension;Lipids;Stress Weight Management/Obesity;Hypertension;Lipids;Stress Weight Management/Obesity;Hypertension;Lipids;Stress     Review Tannor started cardiac rehab on 05/28/23. Olie did well with exercise. Vital signs were stable Shahrukh continues to do well with exercise at cardiac rehab. Vital signs have been stable. Adyen has lost 2.5 kf since starting cardiac rehab Aliou  continues to do well with exercise at cardiac rehab. Vital signs have been stable. Brynden has lost 2.5 kf since starting cardiac rehab. Uday will complete cardiac rehab on 08/15/23.     Expected Outcomes Dakarai will continue to participate in cardiac rehab for exercise, nutrtion and lifestyle modifications Kindred will continue to participate in cardiac rehab for exercise, nutrtion and lifestyle modifications Deion will continue to participate in cardiac rehab for exercise, nutrtion and lifestyle modifications              Core Components/Risk Factors/Patient Goals at Discharge (Final Review):   Goals and Risk Factor Review - 07/26/23 1350       Core Components/Risk Factors/Patient Goals Review   Personal Goals Review Weight Management/Obesity;Hypertension;Lipids;Stress    Review Emrik continues to do well with exercise at cardiac rehab. Vital signs have been stable. Mansel has lost 2.5 kf since starting cardiac rehab. Ezra will complete cardiac rehab on 08/15/23.    Expected Outcomes Harish will continue to participate in cardiac rehab for exercise, nutrtion and lifestyle modifications             ITP Comments:  ITP Comments     Row Name 05/24/23 0756 05/31/23 1054 06/27/23 0901 07/26/23 1010     ITP Comments Rondell Reams, MD: Medical Director.  Intorduction to the [employer] [facility]  Initial orientation packet reviewed with the patient. 30 Day ITP Review. Treyton started cardiac rehab on 05/28/23. Ezrah did well on his first day and is off to a good start to exercise. 30 Day ITP Review. Chin has good participation and attendance in cardiac rehab 30 Day ITP Review. Reynolds has good participation and attendance  in cardiac rehab. Miran will complete cardiac rehab on 08/15/23.             Comments: See ITP comment

## 2023-08-01 ENCOUNTER — Ambulatory Visit: Payer: 59 | Attending: General Practice | Admitting: General Practice

## 2023-08-01 ENCOUNTER — Encounter: Payer: Self-pay | Admitting: General Practice

## 2023-08-01 ENCOUNTER — Encounter (HOSPITAL_COMMUNITY)
Admission: RE | Admit: 2023-08-01 | Discharge: 2023-08-01 | Disposition: A | Discharge status: Home / Self Care | Payer: 59 | Source: Ambulatory Visit | Attending: Cardiovascular Disease | Admitting: Cardiovascular Disease

## 2023-08-01 ENCOUNTER — Ambulatory Visit: Payer: 59 | Admitting: Student

## 2023-08-01 VITALS — BP 118/68 | HR 79 | Ht 67.0 in | Wt 232.6 lb

## 2023-08-01 DIAGNOSIS — Z955 Presence of coronary angioplasty implant and graft: Secondary | ICD-10-CM | POA: Insufficient documentation

## 2023-08-01 DIAGNOSIS — Z48812 Encounter for surgical aftercare following surgery on the circulatory system: Secondary | ICD-10-CM | POA: Insufficient documentation

## 2023-08-01 DIAGNOSIS — I10 Essential (primary) hypertension: Secondary | ICD-10-CM

## 2023-08-01 DIAGNOSIS — I2510 Atherosclerotic heart disease of native coronary artery without angina pectoris: Secondary | ICD-10-CM

## 2023-08-01 DIAGNOSIS — E785 Hyperlipidemia, unspecified: Secondary | ICD-10-CM | POA: Diagnosis not present

## 2023-08-01 DIAGNOSIS — I2102 ST elevation (STEMI) myocardial infarction involving left anterior descending coronary artery: Secondary | ICD-10-CM | POA: Diagnosis present

## 2023-08-01 NOTE — Patient Instructions (Signed)
 Medication Instructions:  The current medical regimen is effective;  continue present plan and medications as directed. Please refer to the Current Medication list given to you today.  *If you need a refill on your cardiac medications before your next appointment, please call your pharmacy*  Lab Work: FASTING LIPID AND LFT IN MAY 2025 If you have labs (blood work) drawn today and your tests are completely normal, you will receive your results only by:  MyChart Message (if you have MyChart) OR  A paper copy in the mail If you have any lab test that is abnormal or we need to change your treatment, we will call you to review the results.  Testing/Procedures: NONE  Follow-Up: At [facility], you and your health needs are our priority.  As part of our continuing mission to provide you with exceptional heart care, we have created designated Provider Care Teams.  These Care Teams include your primary Cardiologist (physician) and Advanced Practice Providers (APPs -  Physician Assistants and Nurse Practitioners) who all work together to provide you with the care you need, when you need it.  Your next appointment:   NOVEMBER  2025  Provider:   Tonny Bollman, MD  or Edd Fabian, FNP

## 2023-08-03 ENCOUNTER — Encounter (HOSPITAL_COMMUNITY)
Admission: RE | Admit: 2023-08-03 | Discharge: 2023-08-03 | Disposition: A | Discharge status: Home / Self Care | Payer: 59 | Source: Ambulatory Visit | Attending: Cardiovascular Disease | Admitting: Cardiovascular Disease

## 2023-08-03 DIAGNOSIS — Z48812 Encounter for surgical aftercare following surgery on the circulatory system: Secondary | ICD-10-CM | POA: Diagnosis not present

## 2023-08-03 DIAGNOSIS — I2102 ST elevation (STEMI) myocardial infarction involving left anterior descending coronary artery: Secondary | ICD-10-CM

## 2023-08-03 DIAGNOSIS — Z955 Presence of coronary angioplasty implant and graft: Secondary | ICD-10-CM

## 2023-08-06 ENCOUNTER — Encounter (HOSPITAL_COMMUNITY)
Admission: RE | Admit: 2023-08-06 | Discharge: 2023-08-06 | Disposition: A | Discharge status: Home / Self Care | Payer: 59 | Source: Ambulatory Visit | Attending: Cardiovascular Disease | Admitting: Cardiovascular Disease

## 2023-08-06 DIAGNOSIS — Z48812 Encounter for surgical aftercare following surgery on the circulatory system: Secondary | ICD-10-CM | POA: Diagnosis not present

## 2023-08-06 DIAGNOSIS — I2102 ST elevation (STEMI) myocardial infarction involving left anterior descending coronary artery: Secondary | ICD-10-CM

## 2023-08-06 DIAGNOSIS — Z955 Presence of coronary angioplasty implant and graft: Secondary | ICD-10-CM

## 2023-08-07 NOTE — Telephone Encounter (Signed)
 FORM FAXED

## 2023-08-08 ENCOUNTER — Encounter (HOSPITAL_COMMUNITY)
Admission: RE | Admit: 2023-08-08 | Discharge: 2023-08-08 | Disposition: A | Discharge status: Home / Self Care | Payer: 59 | Source: Ambulatory Visit | Attending: Cardiovascular Disease | Admitting: Cardiovascular Disease

## 2023-08-08 DIAGNOSIS — Z955 Presence of coronary angioplasty implant and graft: Secondary | ICD-10-CM

## 2023-08-08 DIAGNOSIS — Z48812 Encounter for surgical aftercare following surgery on the circulatory system: Secondary | ICD-10-CM | POA: Diagnosis not present

## 2023-08-08 DIAGNOSIS — I2102 ST elevation (STEMI) myocardial infarction involving left anterior descending coronary artery: Secondary | ICD-10-CM

## 2023-08-10 ENCOUNTER — Encounter (HOSPITAL_COMMUNITY)
Admission: RE | Admit: 2023-08-10 | Discharge: 2023-08-10 | Disposition: A | Discharge status: Home / Self Care | Payer: 59 | Source: Ambulatory Visit | Attending: Cardiovascular Disease | Admitting: Cardiovascular Disease

## 2023-08-10 DIAGNOSIS — Z48812 Encounter for surgical aftercare following surgery on the circulatory system: Secondary | ICD-10-CM | POA: Diagnosis not present

## 2023-08-10 DIAGNOSIS — I2102 ST elevation (STEMI) myocardial infarction involving left anterior descending coronary artery: Secondary | ICD-10-CM

## 2023-08-10 DIAGNOSIS — Z955 Presence of coronary angioplasty implant and graft: Secondary | ICD-10-CM

## 2023-08-13 ENCOUNTER — Encounter (HOSPITAL_COMMUNITY)
Admission: RE | Admit: 2023-08-13 | Discharge: 2023-08-13 | Disposition: A | Discharge status: Home / Self Care | Payer: 59 | Source: Ambulatory Visit | Attending: Cardiovascular Disease | Admitting: Cardiovascular Disease

## 2023-08-13 VITALS — Ht 67.0 in | Wt 231.5 lb

## 2023-08-13 DIAGNOSIS — Z48812 Encounter for surgical aftercare following surgery on the circulatory system: Secondary | ICD-10-CM | POA: Diagnosis not present

## 2023-08-13 DIAGNOSIS — Z955 Presence of coronary angioplasty implant and graft: Secondary | ICD-10-CM

## 2023-08-13 DIAGNOSIS — I2102 ST elevation (STEMI) myocardial infarction involving left anterior descending coronary artery: Secondary | ICD-10-CM

## 2023-08-13 NOTE — Telephone Encounter (Signed)
 Form faxed

## 2023-08-15 ENCOUNTER — Encounter (HOSPITAL_COMMUNITY)
Admission: RE | Admit: 2023-08-15 | Discharge: 2023-08-15 | Disposition: A | Discharge status: Home / Self Care | Payer: 59 | Source: Ambulatory Visit | Attending: Cardiovascular Disease | Admitting: Cardiovascular Disease

## 2023-08-15 DIAGNOSIS — Z48812 Encounter for surgical aftercare following surgery on the circulatory system: Secondary | ICD-10-CM | POA: Diagnosis not present

## 2023-08-15 DIAGNOSIS — I2102 ST elevation (STEMI) myocardial infarction involving left anterior descending coronary artery: Secondary | ICD-10-CM

## 2023-08-15 DIAGNOSIS — Z955 Presence of coronary angioplasty implant and graft: Secondary | ICD-10-CM

## 2023-08-16 NOTE — Progress Notes (Signed)
 Discharge Progress Report  Patient Details  Name: Christian Foster MRN: [id number] Date of Birth: 1970/10/27 Referring Provider:   Flowsheet Row INTENSIVE CARDIAC REHAB ORIENT from 05/24/2023 in [facility]  Referring Provider Dr. Tonny Bollman MD        Number of Visits: 29 exercise visits / 6 education sessions  Reason for Discharge:  Patient reached a stable level of exercise. Patient independent in their exercise. Patient has met program and personal goals.  Smoking History:  Social History   Tobacco Use  Smoking Status Never  Smokeless Tobacco Never    Diagnosis:  04/28/23 ST elevation myocardial infarction involving left anterior descending (LAD) coronary artery (HCC)  04/28/23 Status post coronary artery stent placement LAD  ADL [facility]:   Initial Exercise Prescription:  Initial Exercise Prescription - 05/24/23 1000       Date of Initial Exercise RX and Referring Provider   Date 05/24/23    Referring Provider Dr. Tonny Bollman MD    Expected Discharge Date 08/15/23      NuStep   Level 2    SPM 80    Minutes 15    METs 2      Recumbant Elliptical   Level 1    RPM 40    Watts 60    Minutes 15    METs 2      Prescription Details   Frequency (times per week) 3    Duration Progress to 30 minutes of continuous aerobic without signs/symptoms of physical distress      Intensity   THRR 40-80% of Max Heartrate 68-136    Ratings of Perceived Exertion 11-13    Perceived Dyspnea 0-4      Progression   Progression Continue progressive overload as per policy without signs/symptoms or physical distress.      Resistance Training   Training Prescription Yes    Weight 3    Reps 10-15             Discharge Exercise Prescription (Final Exercise Prescription Changes):  Exercise Prescription Changes - 08/15/23 0828       Response to Exercise   Blood Pressure (Admit) 118/60    Blood Pressure  (Exit) 106/66    Heart Rate (Admit) 74 bpm    Heart Rate (Exercise) 115 bpm    Heart Rate (Exit) 83 bpm    Rating of Perceived Exertion (Exercise) 10    Perceived Dyspnea (Exercise) 0    Symptoms None    Comments Reviewed MET's    Duration Continue with 30 min of aerobic exercise without signs/symptoms of physical distress.    Intensity THRR unchanged      Progression   Progression Continue to progress workloads to maintain intensity without signs/symptoms of physical distress.    Average METs 3.5      Resistance Training   Training Prescription Yes    Weight 6    Reps 10-15    Time 10 Minutes      Bike   Level 2.5    Watts 47    Minutes 80    METs 3.2      NuStep   Level 5    SPM 127    Minutes 15    METs 3.8      Home Exercise Plan   Plans to continue exercise at Home (comment)    Frequency Add 3 additional days to program exercise sessions.    Initial Home Exercises  Provided 08/10/23             Functional Capacity:  6 Minute Walk     Row Name 05/24/23 1014 08/13/23 0819       6 Minute Walk   Phase Initial Discharge    Distance 1370 feet 1895 feet    Distance % Change -- 38.32 %    Distance Feet Change -- 525 ft    Walk Time 6 minutes 6 minutes    # of Rest Breaks 0 0    MPH 2.59 3.59    METS 3.52 4.49    RPE 11 11    Perceived Dyspnea  0 0    VO2 Peak 12.3 15.73    Symptoms No No    Resting HR 76 bpm 71 bpm    Resting BP 118/78 106/68    Resting Oxygen Saturation  98 % --    Exercise Oxygen Saturation  during 6 min walk 98 % --    Max Ex. HR 91 bpm 98 bpm    Max Ex. BP 134/80 130/64    2 Minute Post BP 120/78 124/72             Psychological, QOL, Others - Outcomes: PHQ 2/9:    08/15/2023    1:22 PM 05/24/2023   10:24 AM  Depression screen PHQ 2/9  Decreased Interest 0 2  Down, Depressed, Hopeless 0 0  PHQ - 2 Score 0 2  Altered sleeping 0 1  Tired, decreased energy 0 2  Change in appetite 0 0  Feeling bad or failure about  yourself  0 0  Trouble concentrating 1 2  Moving slowly or fidgety/restless 0 0  Suicidal thoughts  0  PHQ-9 Score 1 7  Difficult doing work/chores Not difficult at all Not difficult at all    Quality of Life:  Quality of Life - 08/15/23 1425       Quality of Life   Select Quality of Life      Quality of Life Scores   Health/Function Post 29.03 %    Socioeconomic Post 30 %    Psych/Spiritual Post 28.29 %    Family Post 30 %    GLOBAL Post 29.22 %             Personal Goals: Goals established at orientation with interventions provided to work toward goal.  Personal Goals and Risk Factors at Admission - 05/24/23 0943       Core Components/Risk Factors/Patient Goals on Admission    Weight Management Yes;Weight Loss    Intervention Weight Management: Develop a combined nutrition and exercise program designed to reach desired caloric intake, while maintaining appropriate intake of nutrient and fiber, sodium and fats, and appropriate energy expenditure required for the weight goal.;Weight Management: Provide education and appropriate resources to help participant work on and attain dietary goals.;Weight Management/Obesity: Establish reasonable short term and long term weight goals.;Obesity: Provide education and appropriate resources to help participant work on and attain dietary goals.    Goal Weight: Long Term 234 lb 3.2 oz (106.2 kg)    Hypertension Yes    Intervention Provide education on lifestyle modifcations including regular physical activity/exercise, weight management, moderate sodium restriction and increased consumption of fresh fruit, vegetables, and low fat dairy, alcohol moderation, and smoking cessation.;Monitor prescription use compliance.    Expected Outcomes Short Term: Continued assessment and intervention until BP is < 140/28mm HG in hypertensive participants. < 130/88mm HG in hypertensive participants with diabetes, heart  failure or chronic kidney disease.;Long  Term: Maintenance of blood pressure at goal levels.    Lipids Yes    Intervention Provide education and support for participant on nutrition & aerobic/resistive exercise along with prescribed medications to achieve LDL 70mg , HDL >40mg .    Expected Outcomes Short Term: Participant states understanding of desired cholesterol values and is compliant with medications prescribed. Participant is following exercise prescription and nutrition guidelines.;Long Term: Cholesterol controlled with medications as prescribed, with individualized exercise RX and with personalized nutrition plan. Value goals: LDL < 70mg , HDL > 40 mg.    Stress Yes    Intervention Offer individual and/or small group education and counseling on adjustment to heart disease, stress management and health-related lifestyle change. Teach and support self-help strategies.;Refer participants experiencing significant psychosocial distress to appropriate mental health specialists for further evaluation and treatment. When possible, include family members and significant others in education/counseling sessions.    Expected Outcomes Long Term: Emotional wellbeing is indicated by absence of clinically significant psychosocial distress or social isolation.;Short Term: Participant demonstrates changes in health-related behavior, relaxation and other stress management skills, ability to obtain effective social support, and compliance with psychotropic medications if prescribed.    Personal Goal Other Yes    Personal Goal Short: more diet info, form exercise routine Long; flexibility and strength    Intervention Will continue to monitor pt and progress work loads as toleratred without sign or symptom    Expected Outcomes Pt will achieve his goals and gain strength              Personal Goals Discharge:  Goals and Risk Factor Review     Row Name 05/31/23 1100 06/27/23 0840 07/26/23 1350         Core Components/Risk Factors/Patient Goals  Review   Personal Goals Review Weight Management/Obesity;Hypertension;Lipids;Stress Weight Management/Obesity;Hypertension;Lipids;Stress Weight Management/Obesity;Hypertension;Lipids;Stress     Review Christian Foster started cardiac rehab on 05/28/23. Christian Foster did well with exercise. Vital signs were stable Christian Foster continues to do well with exercise at cardiac rehab. Vital signs have been stable. Christian Foster has lost 2.5 kf since starting cardiac rehab Christian Foster continues to do well with exercise at cardiac rehab. Vital signs have been stable. Christian Foster has lost 2.5 kf since starting cardiac rehab. Christian Foster will complete cardiac rehab on 08/15/23.     Expected Outcomes Christian Foster will continue to participate in cardiac rehab for exercise, nutrtion and lifestyle modifications Christian Foster will continue to participate in cardiac rehab for exercise, nutrtion and lifestyle modifications Christian Foster will continue to participate in cardiac rehab for exercise, nutrtion and lifestyle modifications              Exercise Goals and Review:  Exercise Goals     Row Name 05/24/23 0758             Exercise Goals   Increase Physical Activity Yes       Intervention Provide advice, education, support and counseling about physical activity/exercise needs.;Develop an individualized exercise prescription for aerobic and resistive training based on initial evaluation findings, risk stratification, comorbidities and participant's personal goals.       Expected Outcomes Short Term: Attend rehab on a regular basis to increase amount of physical activity.;Long Term: Add in home exercise to make exercise part of routine and to increase amount of physical activity.;Long Term: Exercising regularly at least 3-5 days a week.       Increase Strength and Stamina Yes       Intervention Provide advice, education, support and counseling about  physical activity/exercise needs.;Develop an individualized exercise prescription for aerobic and resistive training based on initial evaluation  findings, risk stratification, comorbidities and participant's personal goals.       Expected Outcomes Short Term: Increase workloads from initial exercise prescription for resistance, speed, and METs.;Short Term: Perform resistance training exercises routinely during rehab and add in resistance training at home;Long Term: Improve cardiorespiratory fitness, muscular endurance and strength as measured by increased METs and functional capacity ( )       Able to understand and use rate of perceived exertion (RPE) scale Yes       Intervention Provide education and explanation on how to use RPE scale       Expected Outcomes Short Term: Able to use RPE daily in rehab to express subjective intensity level;Long Term:  Able to use RPE to guide intensity level when exercising independently       Knowledge and understanding of Target Heart Rate Range (THRR) Yes       Intervention Provide education and explanation of THRR including how the numbers were predicted and where they are located for reference       Expected Outcomes Short Term: Able to state/look up THRR;Long Term: Able to use THRR to govern intensity when exercising independently;Short Term: Able to use daily as guideline for intensity in rehab       Understanding of Exercise Prescription Yes       Intervention Provide education, explanation, and written materials on patient's individual exercise prescription       Expected Outcomes Short Term: Able to explain program exercise prescription;Long Term: Able to explain home exercise prescription to exercise independently                Exercise Goals Re-Evaluation:  Exercise Goals Re-Evaluation     Row Name 05/28/23 0837 06/15/23 0824 07/20/23 0959 08/15/23 0830       Exercise Goal Re-Evaluation   Exercise Goals Review Increase Physical Activity;Understanding of Exercise Prescription;Increase Strength and Stamina;Knowledge and understanding of Target Heart Rate Range (THRR);Able to understand  and use rate of perceived exertion (RPE) scale Increase Physical Activity;Understanding of Exercise Prescription;Increase Strength and Stamina;Knowledge and understanding of Target Heart Rate Range (THRR);Able to understand and use rate of perceived exertion (RPE) scale Increase Physical Activity;Understanding of Exercise Prescription;Increase Strength and Stamina;Knowledge and understanding of Target Heart Rate Range (THRR);Able to understand and use rate of perceived exertion (RPE) scale Increase Physical Activity;Understanding of Exercise Prescription;Increase Strength and Stamina;Knowledge and understanding of Target Heart Rate Range (THRR);Able to understand and use rate of perceived exertion (RPE) scale    Comments Pt first day in program, pt tolerated exercise without unusual signs or symptoms. Pt averaged 2.8 Mets first day, will continue to monitor and increase workload as tolerated. Reviewed MET's and goals. Pt averaged 3.3 Mets and tolerated exercise well. Pt feels good about his goals and has already made some food changes and is increasing strength, continues to work on flexibility training, but is starting to add some in on his own at home. Will review home ExRx soon. Just got back from vacation, will work on routine in [employer] and add more as ready Reviewed MET's and goals. Pt tolerated exercise well with an average MET level of 4.35. Pt is doing well and progressing MET's. He feels good with his exercise routine and feels like he's gaining strength. Asked pt about exercise at home, says he's thinking about getting an elliptical. Encouraged him to try to find something that will work for  him at home before graduation so he can continue his progress Pt graduated the [employer]. Pt tolerated exercise well with an average MET level of 3.5. Pt did very well and increased his post walk test by 574ft for a total of 1850ft. He plans to exercise on his own by walking, stationary bike and [employer]  app for 30 mins 3-5 days a week.    Expected Outcomes Will continue to monitor and increase workload as tolerared. Will continue to monitor and increase workload as tolerared. Will continue to monitor and increase workload as tolerared. Pt will continue to exercise on his own and gain strength             Nutrition & Weight - Outcomes:  Pre Biometrics - 05/24/23 0917       Pre Biometrics   Waist Circumference 43 inches    Hip Circumference 45 inches    Waist to Hip Ratio 0.96 %    Triceps Skinfold 12 mm    % Body Fat 31 %    Grip Strength 55 kg    Flexibility 4 in    Single Leg Stand 30 seconds             Post Biometrics - 08/13/23 0821        Post  Biometrics   Height 5\' 7"  (1.702 m)    Weight 105 kg    Waist Circumference 44 inches    Hip Circumference 45 inches    Waist to Hip Ratio 0.98 %    BMI (Calculated) 36.25    Triceps Skinfold 9 mm    % Body Fat 30.1 %    Grip Strength 59 kg    Flexibility 11 in    Single Leg Stand 30 seconds             Nutrition:  Nutrition Therapy & Goals - 07/24/23 0805       Nutrition Therapy   Diet Heart Healthy Diet    Drug/Food Interactions Statins/Certain Fruits      Personal Nutrition Goals   Nutrition Goal Patient to identify strategies for reducing cardiovascular risk by attending the [employer] education and nutrition series weekly.   goal in progress.   Personal Goal #2 Patient to improve diet quality by using the plate method as a guide for meal planning to include lean protein/plant protein, fruits, vegetables, whole grains, nonfat dairy as part of a well-balanced diet.   goal in progress.   Comments Goals in progress. Christian Foster continues to attend the [employer] and nutrition series regularly. His lipids remain well controlled with on lipitor, zetia; triglycerides improved WNL. HTN is well controlled on amlodipine. He is down 5.5# since starting with our program. Patient will benefit from participation in  intensive cardiac rehab for nutrition, exercise, and lifestyle modification.      Intervention Plan   Intervention Prescribe, educate and counsel regarding individualized specific dietary modifications aiming towards targeted core components such as weight, hypertension, lipid management, diabetes, heart failure and other comorbidities.;Nutrition handout(s) given to patient.    Expected Outcomes Short Term Goal: Understand basic principles of dietary content, such as calories, fat, sodium, cholesterol and nutrients.;Short Term Goal: A plan has been developed with personal nutrition goals set during dietitian appointment.;Long Term Goal: Adherence to prescribed nutrition plan.             Nutrition Discharge:  Nutrition Assessments - 08/15/23 1426       Rate Your Plate Scores  Post Score 84             Education Questionnaire Score:  Knowledge Questionnaire Score - 08/15/23 1427       Knowledge Questionnaire Score   Post Score 23/24            Pt graduated from  cardiac rehab program on 08/15/2023 with completion of 29 exercise and 6 education sessions. Pt maintained good attendance and progressed nicely during their participation in rehab as evidenced by increased MET level.   Medication list reconciled. Repeat  PHQ9 score=1.  Pt has made significant lifestyle changes and should be commended for their success.  Christian Foster achieved his goals during cardiac rehab. He plans to continue with his exercise plan post cardiac rehab participation.  Dr. Armanda Magic Medical Director Cardiac Rehabilitation

## 2023-09-04 ENCOUNTER — Other Ambulatory Visit: Payer: Self-pay | Admitting: Student

## 2023-10-26 ENCOUNTER — Ambulatory Visit: Payer: Self-pay | Admitting: Student

## 2023-10-26 LAB — LIPID PANEL
Chol/HDL Ratio: 3 ratio (ref 0.0–5.0)
Cholesterol, Total: 107 mg/dL (ref 100–199)
HDL: 36 mg/dL — ABNORMAL LOW (ref >39)
LDL Chol Calc (NIH): 51 mg/dL (ref 0–99)
Triglycerides: 107 mg/dL (ref 0–149)
VLDL Cholesterol Cal: 20 mg/dL (ref 5–40)

## 2023-10-26 LAB — HEPATIC FUNCTION PANEL
ALT: 41 IU/L (ref 0–44)
AST: 25 IU/L (ref 0–40)
Albumin: 4.5 g/dL (ref 3.8–4.9)
Alkaline Phosphatase: 66 IU/L (ref 44–121)
Bilirubin Total: 0.7 mg/dL (ref 0.0–1.2)
Bilirubin, Direct: 0.23 mg/dL (ref 0.00–0.40)
Total Protein: 6.7 g/dL (ref 6.0–8.5)

## 2024-03-19 ENCOUNTER — Other Ambulatory Visit (HOSPITAL_BASED_OUTPATIENT_CLINIC_OR_DEPARTMENT_OTHER): Payer: Self-pay | Admitting: Family Medicine

## 2024-03-19 DIAGNOSIS — Z8249 Family history of ischemic heart disease and other diseases of the circulatory system: Secondary | ICD-10-CM

## 2024-04-01 ENCOUNTER — Ambulatory Visit (HOSPITAL_BASED_OUTPATIENT_CLINIC_OR_DEPARTMENT_OTHER)
Admission: RE | Admit: 2024-04-01 | Discharge: 2024-04-01 | Disposition: A | Discharge status: Home / Self Care | Payer: Self-pay | Source: Ambulatory Visit | Attending: Family Medicine | Admitting: Family Medicine

## 2024-04-01 DIAGNOSIS — Z8249 Family history of ischemic heart disease and other diseases of the circulatory system: Secondary | ICD-10-CM

## 2024-04-19 ENCOUNTER — Other Ambulatory Visit: Payer: Self-pay | Admitting: Student

## 2024-04-21 ENCOUNTER — Other Ambulatory Visit: Payer: Self-pay | Admitting: Student

## 2024-06-25 NOTE — Progress Notes (Unsigned)
 " Cardiology Office Note:    Date:  06/26/2024   ID:  Christian Foster, DOB Jul 29, 1970, MRN [id number]  PCP:  Millicent Sharper, MD   [facility] Providers Cardiologist:  Sharper Fell, MD     Referring MD: Millicent Sharper, MD   Chief Complaint  Patient presents with   Coronary Artery Disease    History of Present Illness:    Christian Foster is a 54 y.o. male with a hx of coronary artery disease and mixed hyperlipidemia, presenting for follow-up evaluation.  The patient initially presented with an anterior STEMI in 2024 and was treated with PCI of the proximal LAD.  He had no other significant CAD.  LVEF was normal and he was treated with aspirin  and prasugrel .  His lipids have been treated with ezetimibe  and atorvastatin .  Blood pressure has been managed with amlodipine .  The patient is here alone today.  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  States that he is not exercising but follows a reasonably good diet.  He is compliant with his medications.  He has no cardiac-related concerns. Today, he denies symptoms of palpitations, chest pain, shortness of breath, orthopnea, PND, lower extremity edema, dizziness, or syncope.  Current Medications: Active Medications[1]   Allergies:   Patient has no known allergies.   ROS:   Please see the history of present illness.    All other systems reviewed and are negative.  EKGs/Labs/Other Studies Reviewed:    The following studies were reviewed today: Cardiac Studies & Procedures   ______________________________________________________________________________________________ CARDIAC CATHETERIZATION  CARDIAC CATHETERIZATION 04/28/2023  Conclusion   Ost LAD to Prox LAD lesion is 95% stenosed.   A drug-eluting stent was successfully placed using a SYNERGY XD 4.0X16.   Post intervention, there is a 0% residual stenosis.   The left ventricular systolic function is normal.   LV end  diastolic pressure is normal.   The left ventricular ejection fraction is 55-65% by visual estimate.   Recommend uninterrupted dual antiplatelet therapy with Aspirin  81mg  daily and Prasugrel  10mg  daily for a minimum of 12 months (ACS-Class I recommendation).  1.  Acute anterior STEMI, treated successfully with primary PCI using a 4.0 x 16 mm Synergy DES 2.  Patent left main, left circumflex, and dominant RCA with mild nonobstructive plaquing noted 3.  Normal LV function with no regional wall motion abnormalities and LVEF estimated at 65%  Post-MI medical therapy. ASA/prasugrel  x 12 months without interruption. High-intensity statin Rx. If no complications arise, OK for DC tomorrow.  Findings Coronary Findings Diagnostic  Dominance: Right  Left Anterior Descending Ost LAD to Prox LAD lesion is 95% stenosed. Vessel is the culprit lesion. The lesion is not complex (non high-C). The lesion was not previously treated .  Left Circumflex There is mild diffuse disease throughout the vessel.  Right Coronary Artery The vessel exhibits minimal luminal irregularities.  Intervention  Ost LAD to Prox LAD lesion Stent CATH VISTA GUIDE 6FR XBLAD3.5 guide catheter was inserted. Lesion crossed with guidewire using a WIRE HI TORQ WHISPER MS 190CM. Pre-stent angioplasty was performed using a BALLN EMERGE MR 2.5X15. A drug-eluting stent was successfully placed using a SYNERGY XD 4.0X16. Maximum pressure: 14 atm. Post-stent angioplasty was performed using a BALLN Nora EMERGE MR 4.5X12. Maximum pressure:  16 atm. Post-Intervention Lesion Assessment The intervention was successful. Pre-interventional TIMI flow is 3. Post-intervention TIMI flow is 3. No complications occurred at this lesion. There is a 0% residual  stenosis post intervention.     ECHOCARDIOGRAM  ECHOCARDIOGRAM COMPLETE 04/28/2023  Narrative ECHOCARDIOGRAM REPORT    Patient Name:   Christian Foster Date of Exam: 04/28/2023 Medical Rec #:   [id number]    Height:       67.0 in Accession #:    [id number]   Weight:       227.1 lb Date of Birth:  10-21-1970    BSA:          2.134 m Patient Age:    52 years     BP:           112/86 mmHg Patient Gender: M            HR:           73 bpm. Exam Location:  Inpatient  Procedure: 2D Echo, Cardiac Doppler and Color Doppler  Indications:    Chest pain  History:        Patient has no prior history of Echocardiogram examinations.  Sonographer:    Tinnie Barefoot RDCS Referring Phys: [phone]   Sonographer Comments: Suboptimal subcostal window. IMPRESSIONS   1. Left ventricular ejection fraction, by estimation, is 70 to 75%. The left ventricle has hyperdynamic function. The left ventricle has no regional wall motion abnormalities. Left ventricular diastolic parameters are consistent with Grade I diastolic dysfunction (impaired relaxation). 2. Right ventricular systolic function is normal. The right ventricular size is normal. Tricuspid regurgitation signal is inadequate for assessing PA pressure. 3. The mitral valve is normal in structure. No evidence of mitral valve regurgitation. No evidence of mitral stenosis. 4. The aortic valve is tricuspid. Aortic valve regurgitation is not visualized. No aortic stenosis is present.  FINDINGS Left Ventricle: Left ventricular ejection fraction, by estimation, is 70 to 75%. The left ventricle has hyperdynamic function. The left ventricle has no regional wall motion abnormalities. The left ventricular internal cavity size was normal in size. There is no left ventricular hypertrophy. Left ventricular diastolic parameters are consistent with Grade I diastolic dysfunction (impaired relaxation).  Right Ventricle: The right ventricular size is normal. Right vetricular wall thickness was not well visualized. Right ventricular systolic function is normal. Tricuspid regurgitation signal is inadequate for assessing PA pressure.  Left Atrium: Left  atrial size was normal in size.  Right Atrium: Right atrial size was normal in size.  Pericardium: There is no evidence of pericardial effusion.  Mitral Valve: The mitral valve is normal in structure. No evidence of mitral valve regurgitation. No evidence of mitral valve stenosis.  Tricuspid Valve: The tricuspid valve is normal in structure. Tricuspid valve regurgitation is not demonstrated. No evidence of tricuspid stenosis.  Aortic Valve: The aortic valve is tricuspid. Aortic valve regurgitation is not visualized. No aortic stenosis is present. Aortic valve mean gradient measures 3.3 mmHg. Aortic valve peak gradient measures 4.8 mmHg. Aortic valve area, by VTI measures 2.92 cm.  Pulmonic Valve: The pulmonic valve was not well visualized. Pulmonic valve regurgitation is not visualized. No evidence of pulmonic stenosis.  Aorta: The aortic root and ascending aorta are structurally normal, with no evidence of dilitation.  Venous: The inferior vena cava was not well visualized.  IAS/Shunts: The interatrial septum was not well visualized.   LEFT VENTRICLE PLAX 2D LVIDd:         4.50 cm   Diastology LVIDs:         2.30 cm   LV e' medial:    7.18 cm/s LV PW:  1.00 cm   LV E/e' medial:  6.7 LV IVS:        1.10 cm   LV e' lateral:   12.30 cm/s LVOT diam:     2.20 cm   LV E/e' lateral: 3.9 LV SV:         70 LV SV Index:   33 LVOT Area:     3.80 cm   RIGHT VENTRICLE RV Basal diam:  2.30 cm RV S prime:     30.30 cm/s TAPSE (M-mode): 2.2 cm  LEFT ATRIUM             Index        RIGHT ATRIUM           Index LA diam:        3.60 cm 1.69 cm/m   RA Area:     15.00 cm LA Vol (A2C):   41.8 ml 19.58 ml/m  RA Volume:   34.40 ml  16.12 ml/m LA Vol (A4C):   44.2 ml 20.71 ml/m LA Biplane Vol: 45.0 ml 21.08 ml/m AORTIC VALVE AV Area (Vmax):    3.34 cm AV Area (Vmean):   2.80 cm AV Area (VTI):     2.92 cm AV Vmax:           109.95 cm/s AV Vmean:          87.237 cm/s AV VTI:             0.240 m AV Peak Grad:      4.8 mmHg AV Mean Grad:      3.3 mmHg LVOT Vmax:         96.70 cm/s LVOT Vmean:        64.300 cm/s LVOT VTI:          0.184 m LVOT/AV VTI ratio: 0.77  AORTA Ao Root diam: 3.20 cm Ao Asc diam:  3.20 cm  MITRAL VALVE MV Area (PHT): 3.21 cm    SHUNTS MV Decel Time: 236 msec    Systemic VTI:  0.18 m MV E velocity: 48.00 cm/s  Systemic Diam: 2.20 cm MV A velocity: 61.60 cm/s MV E/A ratio:  0.78  Dorn Ross MD Electronically signed by Dorn Ross MD Signature Date/Time: 04/28/2023/6:46:35 PM    Final      CT SCANS  CT CARDIAC SCORING (SELF PAY ONLY) 04/01/2024  Addendum 04/06/2024  3:32 AM ADDENDUM REPORT: 04/06/2024 03:29  EXAM: OVER-READ INTERPRETATION  CT CHEST  The following report is an over-read performed by radiologist Dr. Oneil Devonshire of [facility] on 04/06/2024. This over-read does not include interpretation of cardiac or coronary anatomy or pathology. The coronary calcium  score interpretation by the cardiologist is attached.  COMPARISON:  None.  FINDINGS: Cardiovascular: There are no significant extracardiac vascular findings.  Mediastinum/Nodes: There are no enlarged lymph nodes within the visualized mediastinum.  Lungs/Pleura: There is no pleural effusion. The visualized lungs appear clear.  Upper abdomen: No significant findings in the visualized upper abdomen.  Musculoskeletal/Chest wall: No chest wall mass or suspicious osseous findings within the visualized chest.  IMPRESSION: No significant extracardiac findings within the visualized chest.   Electronically Signed By: Oneil Devonshire M.D. On: 04/06/2024 03:29  Narrative : CLINICAL DATA:  Cardiovascular Disease Risk stratification  EXAM:  Coronary Calcium  Score  TECHNIQUE:  A gated, non-contrast computed tomography scan of the heart was  performed using 3mm slice thickness. Axial images were analyzed on a  dedicated  workstation. Calcium  scoring of the coronary  arteries was  performed using the Agatston method.  FINDINGS:  Coronary Calcium  Score:  Left main: 0  Left anterior descending artery: 936  Left circumflex artery: 180  Right coronary artery: 51  Total: 1166  Pericardium: Normal.  Ascending Aorta: Normal caliber.  Pulmonary artery: Normal caliber  Non-cardiac: See separate report from [facility].  IMPRESSION:  Coronary calcium  score of 1166. This was 47 percentile for age-, race-,  and sex-matched controls.  RECOMMENDATIONS:  Coronary artery calcium  (CAC) score is a strong predictor of  incident coronary heart disease (CHD) and provides predictive  information beyond traditional risk factors. CAC scoring is  reasonable to use in the decision to withhold, postpone, or initiate  statin therapy in intermediate-risk or selected borderline-risk  asymptomatic adults (age 69-75 years and LDL-C >=70 to <190 mg/dL)  who do not have diabetes or established atherosclerotic  cardiovascular disease (ASCVD).* In intermediate-risk (10-year ASCVD  risk >=7.5% to <20%) adults or selected borderline-risk (10-year  ASCVD risk >=5% to <7.5%) adults in whom a CAC score is measured for  the purpose of making a treatment decision the following  recommendations have been made:  If CAC=0, it is reasonable to withhold statin therapy and reassess  in 5 to 10 years, as long as higher risk conditions are absent  (diabetes mellitus, family history of premature CHD in first degree  relatives (males <55 years; females <65 years), cigarette smoking,  or LDL >=190 mg/dL).  If CAC is 1 to 99, it is reasonable to initiate statin therapy for  patients >=16 years of age.  If CAC is >=100 or >=75th percentile, it is reasonable to initiate  statin therapy at any age.  Cardiology referral should be considered for patients with CAC  scores >=400 or >=75th percentile.  *2018  AHA/ACC/AACVPR/AAPA/ABC/ACPM/ADA/AGS/APhA/ASPC/NLA/PCNA  Guideline on the Management of Blood Cholesterol: A Report of the  American College of Cardiology/American Heart Association Task Force  on Clinical Practice Guidelines. J Am Coll Cardiol.  2019;73(24):3168-3209.  Electronically Signed: By: Lamar Fitch M.D. On: 04/02/2024 17:56     ______________________________________________________________________________________________      EKG:   EKG Interpretation Date/Time:  Thursday June 26 2024 08:17:37 EST Ventricular Rate:  71 PR Interval:  150 QRS Duration:  86 QT Interval:  396 QTC Calculation: 430 R Axis:   1  Text Interpretation: Normal sinus rhythm Nonspecific T wave abnormality When compared with ECG of 11-May-2023 15:02, No significant change was found Confirmed by Wonda Sharper [phone]) on 06/26/2024 8:31:28 AM    Recent Labs: 10/25/2023: ALT 41  Recent Lipid Panel    Component Value Date/Time   CHOL 107 10/25/2023 0807   TRIG 107 10/25/2023 0807   HDL 36 (L) 10/25/2023 0807   CHOLHDL 3.0 10/25/2023 0807   CHOLHDL 5.9 04/28/2023 1419   VLDL 38 04/28/2023 1419   LDLCALC 51 10/25/2023 0807     Risk Assessment/Calculations:                Physical Exam:    VS:  BP 120/80 (BP Location: Right Arm, Patient Position: Sitting, Cuff Size: Large)   Pulse 71   Ht 5' 8 (1.727 m)   Wt 243 lb 12.8 oz (110.6 kg)   SpO2 97%   BMI 37.07 kg/m     Wt Readings from Last 3 Encounters:  06/26/24 243 lb 12.8 oz (110.6 kg)  08/13/23 231 lb 7.7 oz (105 kg)  08/01/23 232 lb 9.6 oz (105.5 kg)     GEN:  Well nourished, well developed in no acute distress HEENT: Normal NECK: No JVD; No carotid bruits LYMPHATICS: No lymphadenopathy CARDIAC: RRR, no murmurs, rubs, gallops RESPIRATORY:  Clear to auscultation without rales, wheezing or rhonchi  ABDOMEN: Soft, non-tender, non-distended MUSCULOSKELETAL:  No edema; No deformity  SKIN: Warm and  dry NEUROLOGIC:  Alert and oriented x 3 PSYCHIATRIC:  Normal affect   Assessment & Plan Coronary artery disease involving native coronary artery of native heart without angina pectoris Patient is now out over 1 year from his acute MI.  Recommend discontinue prasugrel  and remain on aspirin  81 mg daily for lifelong therapy.  Continue high intensity statin drug.  We discussed lifestyle modification today.  I spent time talking to him about specifics about diet and focus on eating real food, decreasing processed foods, and portion control. Primary hypertension Blood pressure well-controlled.  Continue amlodipine .  Work on lifestyle modification. Mixed hyperlipidemia Treated with atorvastatin  and ezetimibe .  Last labs reviewed with an LDL of 51.  The patient is clinically stable from a cardiac perspective.  He has regular primary care follow-up.  We discussed long-term risk reduction measures including continuation of his current medicines, with the exception of prasugrel  which is being stopped today.  We discussed healthy lifestyle choices.  I will plan to see him back as needed.          Medication Adjustments/Labs and Tests Ordered: Current medicines are reviewed at length with the patient today.  Concerns regarding medicines are outlined above.  Orders Placed This Encounter  Procedures   EKG 12-Lead   No orders of the defined types were placed in this encounter.   Patient Instructions  Medication Instructions:  STOP Prasugrel  (Effient )  *If you need a refill on your cardiac medications before your next appointment, please call your pharmacy*  Lab Work: None ordered today. If you have labs (blood work) drawn today and your tests are completely normal, you will receive your results only by: MyChart Message (if you have MyChart) OR A paper copy in the mail If you have any lab test that is abnormal or we need to change your treatment, we will call you to review the  results.  Testing/Procedures: None ordered today.  Follow-Up: At [facility], you and your health needs are our priority.  As part of our continuing mission to provide you with exceptional heart care, our providers are all part of one team.  This team includes your primary Cardiologist (physician) and Advanced Practice Providers or APPs (Physician Assistants and Nurse Practitioners) who all work together to provide you with the care you need, when you need it.  Your next appointment:   As needed    Signed, Ozell Fell, MD  06/26/2024 12:40 PM    [facility]     [1]  Current Meds  Medication Sig   amLODipine  (NORVASC ) 5 MG tablet TAKE 1 TABLET BY MOUTH DAILY   aspirin  EC 81 MG tablet Take 1 tablet by mouth daily.   atorvastatin  (LIPITOR) 80 MG tablet Take 1 tablet (80 mg total) by mouth daily.   escitalopram  (LEXAPRO ) 20 MG tablet Take 20 mg by mouth daily.   ezetimibe  (ZETIA ) 10 MG tablet Take 1 tablet (10 mg total) by mouth daily.   nitroGLYCERIN  (NITROSTAT ) 0.4 MG SL tablet Place 1 tablet (0.4 mg total) under the tongue every 5 (five) minutes x 3 doses as needed for chest pain.   [DISCONTINUED] prasugrel  (EFFIENT ) 10 MG TABS tablet TAKE 1 TABLET BY  MOUTH DAILY   "

## 2024-06-26 ENCOUNTER — Encounter: Payer: Self-pay | Admitting: Cardiovascular Disease

## 2024-06-26 ENCOUNTER — Ambulatory Visit: Admitting: Cardiovascular Disease

## 2024-06-26 VITALS — BP 120/80 | HR 71 | Ht 68.0 in | Wt 243.8 lb

## 2024-06-26 DIAGNOSIS — I2510 Atherosclerotic heart disease of native coronary artery without angina pectoris: Secondary | ICD-10-CM

## 2024-06-26 DIAGNOSIS — E782 Mixed hyperlipidemia: Secondary | ICD-10-CM | POA: Diagnosis not present

## 2024-06-26 DIAGNOSIS — I10 Essential (primary) hypertension: Secondary | ICD-10-CM | POA: Diagnosis not present

## 2024-06-26 NOTE — Patient Instructions (Signed)
 Medication Instructions:  STOP Prasugrel  (Effient )  *If you need a refill on your cardiac medications before your next appointment, please call your pharmacy*  Lab Work: None ordered today. If you have labs (blood work) drawn today and your tests are completely normal, you will receive your results only by: MyChart Message (if you have MyChart) OR A paper copy in the mail If you have any lab test that is abnormal or we need to change your treatment, we will call you to review the results.  Testing/Procedures: None ordered today.  Follow-Up: At [facility], you and your health needs are our priority.  As part of our continuing mission to provide you with exceptional heart care, our providers are all part of one team.  This team includes your primary Cardiologist (physician) and Advanced Practice Providers or APPs (Physician Assistants and Nurse Practitioners) who all work together to provide you with the care you need, when you need it.  Your next appointment:   As needed

## 2024-06-26 NOTE — Assessment & Plan Note (Addendum)
 Blood pressure well-controlled.  Continue amlodipine .  Work on lifestyle modification.

## 2024-06-26 NOTE — Assessment & Plan Note (Addendum)
 Treated with atorvastatin  and ezetimibe .  Last labs reviewed with an LDL of 51.

## 2024-06-26 NOTE — Assessment & Plan Note (Addendum)
 Patient is now out over 1 year from his acute MI.  Recommend discontinue prasugrel  and remain on aspirin  81 mg daily for lifelong therapy.  Continue high intensity statin drug.  We discussed lifestyle modification today.  I spent time talking to him about specifics about diet and focus on eating real food, decreasing processed foods, and portion control.
